# Patient Record
Sex: Female | Born: 1985 | Race: Black or African American | Hispanic: No | Marital: Single | Smoking: Current every day smoker
Health system: Southern US, Community
[De-identification: ages and names within clinical notes are randomized; demographics above are authoritative.]

## PROBLEM LIST (undated history)

## (undated) DIAGNOSIS — R87619 Unspecified abnormal cytological findings in specimens from cervix uteri: Secondary | ICD-10-CM

## (undated) DIAGNOSIS — IMO0002 Reserved for concepts with insufficient information to code with codable children: Secondary | ICD-10-CM

## (undated) DIAGNOSIS — O99320 Drug use complicating pregnancy, unspecified trimester: Principal | ICD-10-CM

## (undated) DIAGNOSIS — A609 Anogenital herpesviral infection, unspecified: Secondary | ICD-10-CM

## (undated) DIAGNOSIS — F1920 Other psychoactive substance dependence, uncomplicated: Principal | ICD-10-CM

## (undated) DIAGNOSIS — O99345 Other mental disorders complicating the puerperium: Secondary | ICD-10-CM

## (undated) DIAGNOSIS — F53 Postpartum depression: Secondary | ICD-10-CM

## (undated) DIAGNOSIS — O99330 Smoking (tobacco) complicating pregnancy, unspecified trimester: Secondary | ICD-10-CM

## (undated) DIAGNOSIS — IMO0001 Reserved for inherently not codable concepts without codable children: Secondary | ICD-10-CM

## (undated) DIAGNOSIS — O039 Complete or unspecified spontaneous abortion without complication: Secondary | ICD-10-CM

## (undated) DIAGNOSIS — O3430 Maternal care for cervical incompetence, unspecified trimester: Secondary | ICD-10-CM

## (undated) HISTORY — DX: Unspecified abnormal cytological findings in specimens from cervix uteri: R87.619

## (undated) HISTORY — DX: Postpartum depression: F53.0

## (undated) HISTORY — PX: CERVICAL CERCLAGE: SHX1329

## (undated) HISTORY — DX: Anogenital herpesviral infection, unspecified: A60.9

## (undated) HISTORY — DX: Other psychoactive substance dependence, uncomplicated: F19.20

## (undated) HISTORY — DX: Maternal care for cervical incompetence, unspecified trimester: O34.30

## (undated) HISTORY — DX: Reserved for concepts with insufficient information to code with codable children: IMO0002

## (undated) HISTORY — DX: Drug use complicating pregnancy, unspecified trimester: O99.320

## (undated) HISTORY — DX: Other mental disorders complicating the puerperium: O99.345

---
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
## 2006-01-15 ENCOUNTER — Emergency Department (HOSPITAL_COMMUNITY): Admission: EM | Admit: 2006-01-15 | Discharge: 2006-01-15 | Payer: Self-pay | Admitting: Family Medicine

## 2006-05-07 ENCOUNTER — Emergency Department (HOSPITAL_COMMUNITY): Admission: EM | Admit: 2006-05-07 | Discharge: 2006-05-07 | Payer: Self-pay | Admitting: Emergency Medicine

## 2008-12-28 ENCOUNTER — Emergency Department (HOSPITAL_COMMUNITY): Admission: EM | Admit: 2008-12-28 | Discharge: 2008-12-29 | Payer: Self-pay | Admitting: Emergency Medicine

## 2009-09-16 ENCOUNTER — Ambulatory Visit: Payer: Self-pay | Admitting: Obstetrics & Gynecology

## 2009-09-19 ENCOUNTER — Inpatient Hospital Stay (HOSPITAL_COMMUNITY): Admission: AD | Admit: 2009-09-19 | Discharge: 2009-09-20 | Payer: Self-pay | Admitting: Family Medicine

## 2009-09-19 ENCOUNTER — Ambulatory Visit: Payer: Self-pay | Admitting: Nurse Practitioner

## 2009-10-13 ENCOUNTER — Ambulatory Visit: Payer: Self-pay | Admitting: Obstetrics & Gynecology

## 2009-10-13 ENCOUNTER — Ambulatory Visit (HOSPITAL_COMMUNITY): Admission: RE | Admit: 2009-10-13 | Discharge: 2009-10-13 | Payer: Self-pay | Admitting: Obstetrics & Gynecology

## 2009-10-14 ENCOUNTER — Ambulatory Visit (HOSPITAL_COMMUNITY): Admission: RE | Admit: 2009-10-14 | Discharge: 2009-10-14 | Payer: Self-pay | Admitting: Obstetrics & Gynecology

## 2009-10-14 ENCOUNTER — Ambulatory Visit: Payer: Self-pay | Admitting: Obstetrics & Gynecology

## 2009-10-28 ENCOUNTER — Ambulatory Visit: Payer: Self-pay | Admitting: Obstetrics & Gynecology

## 2009-10-28 ENCOUNTER — Encounter: Payer: Self-pay | Admitting: Obstetrics & Gynecology

## 2009-10-29 ENCOUNTER — Encounter: Payer: Self-pay | Admitting: Obstetrics & Gynecology

## 2009-10-29 LAB — CONVERTED CEMR LAB: Trich, Wet Prep: NONE SEEN

## 2009-11-04 ENCOUNTER — Ambulatory Visit (HOSPITAL_COMMUNITY): Admission: RE | Admit: 2009-11-04 | Discharge: 2009-11-04 | Payer: Self-pay | Admitting: Obstetrics & Gynecology

## 2009-11-11 ENCOUNTER — Ambulatory Visit: Payer: Self-pay | Admitting: Obstetrics and Gynecology

## 2009-11-18 ENCOUNTER — Ambulatory Visit: Payer: Self-pay | Admitting: Obstetrics and Gynecology

## 2009-11-18 ENCOUNTER — Encounter (INDEPENDENT_AMBULATORY_CARE_PROVIDER_SITE_OTHER): Payer: Self-pay | Admitting: *Deleted

## 2009-11-25 ENCOUNTER — Ambulatory Visit: Payer: Self-pay | Admitting: Obstetrics & Gynecology

## 2009-11-25 ENCOUNTER — Ambulatory Visit (HOSPITAL_COMMUNITY): Admission: RE | Admit: 2009-11-25 | Discharge: 2009-11-25 | Payer: Self-pay | Admitting: Obstetrics & Gynecology

## 2009-12-02 ENCOUNTER — Ambulatory Visit: Payer: Self-pay | Admitting: Family Medicine

## 2009-12-03 ENCOUNTER — Ambulatory Visit: Payer: Self-pay | Admitting: Family

## 2009-12-03 ENCOUNTER — Ambulatory Visit (HOSPITAL_COMMUNITY): Admission: RE | Admit: 2009-12-03 | Discharge: 2009-12-03 | Payer: Self-pay | Admitting: Family Medicine

## 2009-12-08 ENCOUNTER — Ambulatory Visit: Payer: Self-pay | Admitting: Family Medicine

## 2009-12-08 ENCOUNTER — Encounter: Payer: Self-pay | Admitting: Obstetrics and Gynecology

## 2009-12-08 ENCOUNTER — Observation Stay (HOSPITAL_COMMUNITY): Admission: AD | Admit: 2009-12-08 | Discharge: 2009-12-08 | Payer: Self-pay | Admitting: Obstetrics and Gynecology

## 2009-12-18 DEATH — deceased

## 2010-01-19 ENCOUNTER — Encounter (INDEPENDENT_AMBULATORY_CARE_PROVIDER_SITE_OTHER): Payer: Self-pay | Admitting: *Deleted

## 2010-01-19 ENCOUNTER — Ambulatory Visit: Payer: Self-pay | Admitting: Obstetrics and Gynecology

## 2010-01-20 ENCOUNTER — Encounter (INDEPENDENT_AMBULATORY_CARE_PROVIDER_SITE_OTHER): Payer: Self-pay | Admitting: *Deleted

## 2010-01-20 LAB — CONVERTED CEMR LAB: Yeast Wet Prep HPF POC: NONE SEEN

## 2010-01-25 ENCOUNTER — Ambulatory Visit (HOSPITAL_COMMUNITY): Admission: RE | Admit: 2010-01-25 | Discharge: 2010-01-25 | Payer: Self-pay | Admitting: Family Medicine

## 2010-04-04 ENCOUNTER — Ambulatory Visit
Admission: RE | Admit: 2010-04-04 | Discharge: 2010-04-04 | Payer: Self-pay | Source: Home / Self Care | Attending: Obstetrics & Gynecology | Admitting: Obstetrics & Gynecology

## 2010-04-04 ENCOUNTER — Encounter: Payer: Self-pay | Admitting: Obstetrics & Gynecology

## 2010-04-04 LAB — CONVERTED CEMR LAB
HCV Ab: NEGATIVE
HIV: NONREACTIVE

## 2010-04-05 ENCOUNTER — Encounter: Payer: Self-pay | Admitting: Obstetrics & Gynecology

## 2010-04-05 LAB — CONVERTED CEMR LAB: Yeast Wet Prep HPF POC: NONE SEEN

## 2010-04-09 ENCOUNTER — Encounter: Payer: Self-pay | Admitting: Obstetrics & Gynecology

## 2010-04-10 ENCOUNTER — Encounter: Payer: Self-pay | Admitting: Obstetrics & Gynecology

## 2010-06-02 LAB — POCT URINALYSIS DIPSTICK
Bilirubin Urine: NEGATIVE
Bilirubin Urine: NEGATIVE
Glucose, UA: NEGATIVE mg/dL
Glucose, UA: NEGATIVE mg/dL
Nitrite: NEGATIVE
Protein, ur: NEGATIVE mg/dL
Protein, ur: NEGATIVE mg/dL
Urobilinogen, UA: 0.2 mg/dL (ref 0.0–1.0)
Urobilinogen, UA: 0.2 mg/dL (ref 0.0–1.0)
pH: 7 (ref 5.0–8.0)

## 2010-06-02 LAB — CBC
HCT: 36.7 % (ref 36.0–46.0)
Hemoglobin: 12.6 g/dL (ref 12.0–15.0)
MCHC: 34.2 g/dL (ref 30.0–36.0)
MCV: 93.6 fL (ref 78.0–100.0)
RDW: 13.8 % (ref 11.5–15.5)
WBC: 16.1 10*3/uL — ABNORMAL HIGH (ref 4.0–10.5)

## 2010-06-03 LAB — POCT URINALYSIS DIPSTICK
Bilirubin Urine: NEGATIVE
Bilirubin Urine: NEGATIVE
Glucose, UA: NEGATIVE mg/dL
Glucose, UA: NEGATIVE mg/dL
Ketones, ur: NEGATIVE mg/dL
Ketones, ur: NEGATIVE mg/dL
Protein, ur: NEGATIVE mg/dL
Specific Gravity, Urine: 1.02 (ref 1.005–1.030)

## 2010-06-04 LAB — POCT URINALYSIS DIP (DEVICE)
Glucose, UA: NEGATIVE mg/dL
Ketones, ur: NEGATIVE mg/dL
Nitrite: NEGATIVE
Specific Gravity, Urine: 1.015 (ref 1.005–1.030)
pH: 8.5 — ABNORMAL HIGH (ref 5.0–8.0)

## 2010-06-04 LAB — CBC
Hemoglobin: 12.3 g/dL (ref 12.0–15.0)
MCH: 32.3 pg (ref 26.0–34.0)
Platelets: 187 10*3/uL (ref 150–400)

## 2010-06-05 LAB — POCT URINALYSIS DIP (DEVICE)
Nitrite: NEGATIVE
Urobilinogen, UA: 0.2 mg/dL (ref 0.0–1.0)
pH: 8.5 — ABNORMAL HIGH (ref 5.0–8.0)

## 2010-06-05 LAB — URINE MICROSCOPIC-ADD ON: WBC, UA: NONE SEEN WBC/hpf (ref <3)

## 2010-06-05 LAB — WET PREP, GENITAL
Clue Cells Wet Prep HPF POC: NONE SEEN
Trich, Wet Prep: NONE SEEN

## 2010-06-05 LAB — GC/CHLAMYDIA PROBE AMP, GENITAL
Chlamydia, DNA Probe: NEGATIVE
GC Probe Amp, Genital: NEGATIVE

## 2010-06-05 LAB — URINALYSIS, ROUTINE W REFLEX MICROSCOPIC: pH: 5 (ref 5.0–8.0)

## 2010-06-05 LAB — CBC
MCV: 92.8 fL (ref 78.0–100.0)
Platelets: 163 10*3/uL (ref 150–400)
RDW: 13.1 % (ref 11.5–15.5)

## 2010-06-06 ENCOUNTER — Ambulatory Visit: Payer: 59

## 2010-06-06 DIAGNOSIS — Z23 Encounter for immunization: Secondary | ICD-10-CM

## 2010-06-23 LAB — URINALYSIS, ROUTINE W REFLEX MICROSCOPIC
Bilirubin Urine: NEGATIVE
Hgb urine dipstick: NEGATIVE
Ketones, ur: NEGATIVE mg/dL
Protein, ur: NEGATIVE mg/dL
Urobilinogen, UA: 1 mg/dL (ref 0.0–1.0)

## 2010-06-23 LAB — POCT PREGNANCY, URINE: Preg Test, Ur: NEGATIVE

## 2010-06-23 LAB — WET PREP, GENITAL: Trich, Wet Prep: NONE SEEN

## 2010-06-23 LAB — GC/CHLAMYDIA PROBE AMP, GENITAL
Chlamydia, DNA Probe: NEGATIVE
GC Probe Amp, Genital: NEGATIVE

## 2010-06-23 LAB — HERPES SIMPLEX VIRUS CULTURE

## 2010-06-23 LAB — RPR: RPR Ser Ql: NONREACTIVE

## 2010-10-03 ENCOUNTER — Ambulatory Visit: Payer: 59

## 2010-10-06 ENCOUNTER — Ambulatory Visit (INDEPENDENT_AMBULATORY_CARE_PROVIDER_SITE_OTHER): Payer: 59

## 2010-10-06 VITALS — BP 140/82 | HR 73

## 2010-10-06 DIAGNOSIS — Z23 Encounter for immunization: Secondary | ICD-10-CM

## 2010-10-06 MED ORDER — ACYCLOVIR 200 MG PO CAPS: 200.0000 mg | ORAL_CAPSULE | Freq: Two times a day (BID) | ORAL | Status: DC | PRN

## 2011-03-28 DIAGNOSIS — Z8751 Personal history of pre-term labor: Secondary | ICD-10-CM

## 2011-03-28 DIAGNOSIS — O09219 Supervision of pregnancy with history of pre-term labor, unspecified trimester: Secondary | ICD-10-CM | POA: Insufficient documentation

## 2011-03-28 DIAGNOSIS — N883 Incompetence of cervix uteri: Secondary | ICD-10-CM

## 2011-03-28 DIAGNOSIS — O3430 Maternal care for cervical incompetence, unspecified trimester: Secondary | ICD-10-CM | POA: Insufficient documentation

## 2011-03-28 DIAGNOSIS — F1920 Other psychoactive substance dependence, uncomplicated: Secondary | ICD-10-CM | POA: Insufficient documentation

## 2011-03-28 DIAGNOSIS — O99330 Smoking (tobacco) complicating pregnancy, unspecified trimester: Secondary | ICD-10-CM | POA: Insufficient documentation

## 2011-03-28 HISTORY — DX: Other psychoactive substance dependence, uncomplicated: F19.20

## 2011-03-28 HISTORY — DX: Maternal care for cervical incompetence, unspecified trimester: O34.30

## 2011-04-06 ENCOUNTER — Encounter: Payer: Self-pay | Admitting: Family Medicine

## 2011-04-06 ENCOUNTER — Ambulatory Visit (INDEPENDENT_AMBULATORY_CARE_PROVIDER_SITE_OTHER): Payer: Medicaid Other | Admitting: Family Medicine

## 2011-04-06 DIAGNOSIS — O3430 Maternal care for cervical incompetence, unspecified trimester: Secondary | ICD-10-CM

## 2011-04-06 DIAGNOSIS — A609 Anogenital herpesviral infection, unspecified: Secondary | ICD-10-CM | POA: Insufficient documentation

## 2011-04-06 DIAGNOSIS — Z8751 Personal history of pre-term labor: Secondary | ICD-10-CM

## 2011-04-06 DIAGNOSIS — F1920 Other psychoactive substance dependence, uncomplicated: Secondary | ICD-10-CM

## 2011-04-06 DIAGNOSIS — O99320 Drug use complicating pregnancy, unspecified trimester: Secondary | ICD-10-CM

## 2011-04-06 DIAGNOSIS — B009 Herpesviral infection, unspecified: Secondary | ICD-10-CM

## 2011-04-06 HISTORY — DX: Anogenital herpesviral infection, unspecified: A60.9

## 2011-04-06 LAB — POCT URINALYSIS DIP (DEVICE)
Glucose, UA: NEGATIVE mg/dL
Hgb urine dipstick: NEGATIVE
Specific Gravity, Urine: 1.02 (ref 1.005–1.030)
Urobilinogen, UA: 0.2 mg/dL (ref 0.0–1.0)
pH: 7 (ref 5.0–8.0)

## 2011-04-06 LAB — WET PREP, GENITAL: Trich, Wet Prep: NONE SEEN

## 2011-04-06 MED ORDER — BORIC ACID POWD: 1.0000 | Freq: Every evening | Status: DC | PRN

## 2011-04-06 MED ORDER — PRENATAL VITAMINS 0.8 MG PO TABS: 1.0000 | ORAL_TABLET | Freq: Every day | ORAL | Status: AC

## 2011-04-06 NOTE — Progress Notes (Signed)
Informal [place] scan for viability- cardiac activity observed, Dr. Shawnie Pons notified.

## 2011-04-06 NOTE — Patient Instructions (Signed)
Cervical Insufficiency Cervical insufficiency (CI) is when the cervix is not strong enough to keep a baby (fetus) inside the womb (uterus). It occurs in the 2nd and early 3rd trimesters of pregnancy. The cervix will enlarge (dilate) on its own without contractions. When this happens, the membranes around the fetus will often balloon down into the birth canal (vagina). The membranes may break, which could end the pregnancy (miscarriage). CAUSES  The cause of a cervical insufficiency is often not known. Possible causes include:  Injury to the cervix from a past pregnancy.   Injury to the cervix from past surgeries.   Being born with this defect of the cervix.   Cold cone, laser or LEEP (Loop electrocautery excision procedure) to the cervix.   Over dilating the cervix during an abortion.   Being exposed to DES (diethylstilbestrol) during pregnancy.   Lack of tissue (elastin and collagen) in the cervix that holds the baby in uterus.   Shorter cervix than normal.  SYMPTOMS   Spotting or bleeding from the vagina.   Feeling pressure in the vagina.   Unusual or abnormal vaginal discharge.  DIAGNOSIS   In many cases, the diagnosis is not made until after the pregnancy is lost.   Often this diagnosis will be made by exam.   Sometimes, an ultrasound of the cervix may be helpful. The ultrasound measures and follows the length of the cervix in women who are at risk of having CI.   Your caregiver may follow the dilatation of the cervix. Often, the diagnosis cannot be made until it happens. When this is the case, there is a much greater chance of early loss of the pregnancy. This means the baby is born too early to survive outside of the mother.  PREVENTION   A high risk patient needs to get frequent vaginal exams and serial ultrasounds.   Tie a suture, like a purse string, around the cervix (cerclage), before getting pregnant.  TREATMENT  When CI is diagnosed early, the treatment is a  cerclage. This gives the cervix added support. The cerclage helps carry the baby to term. This is usually done before the first trimester (12 to 14 weeks). Cerclage is usually not done after the second trimester (24 weeks) unless it is an emergency. Your caregiver can discuss the risks of this procedure. The cerclage suture may be removed when labor begins or at term before labor begins. The suture can also be left in place for future pregnancies. If left in place, the baby is delivered by Cesarean section. HOME CARE INSTRUCTIONS   Keep your follow-up prenatal appointments.   Take medication as directed by your caregiver.   Avoid physical activities, exercise and sexual intercourse until you have permission from your caregiver.   Do not douche or use tampons.   Resume your usual diet.  SEEK MEDICAL CARE IF:  You develop abnormal vaginal discharge. SEEK IMMEDIATE MEDICAL CARE IF:   You have a fever.   You develop uterine contractions.   You do not feel the baby moving or the baby is not moving as much as usual.   You pass out.   You have vaginal bleeding.   You are leaking fluid or have a gush of fluid from your vagina.   You have blood in your urine or pain when urinating.  Document Released: 03/06/2005 Document Revised: 11/16/2010 Document Reviewed: 06/24/2008 [employer] Patient Information 2012 [employer]. 

## 2011-04-06 NOTE — Progress Notes (Signed)
Addended by: Reva Bores on: 04/06/2011 11:51 AM   Modules accepted: Orders

## 2011-04-06 NOTE — Progress Notes (Signed)
Complains of d/c---will schedule with MFM for recommendations. Boric acid for recurrent BV Check wet prep Subjective:    Ellen Clements is a 26 y.o. [id number] [redacted]w[redacted]d being seen today for her obstetrical visit.  Patient reports no complaints. Fetal movement: normal.  Objective:    BP 123/84  Temp 97.6 F (36.4 C)  Wt 186 lb 12.8 oz (84.732 kg)  LMP 01/30/2011  Physical Exam  Exam  FHT:  pos on u/s  Uterine Size: size equals dates        Assessment:    Pregnancy:  [id number]    Plan:    Patient Active Problem List  Diagnoses  . History of pre-term labor  . Cervical incompetence, antepartum condition or complication  . Drug dependence, antepartum  . Tobacco use disorder complicating pregnancy, childbirth, or the puerperium, antepartum condition or complication  . HSV (herpes simplex virus) anogenital infection     Follow up in 2 Weeks.

## 2011-04-06 NOTE — Progress Notes (Signed)
[facility] consult 04/12/11 at 3pm.

## 2011-04-06 NOTE — Progress Notes (Signed)
Pain in lower abdomen. Pulse 78. Pt states had some  vaginal bleeding 2 weeks ago after intercourse. Pulse 78.

## 2011-04-07 ENCOUNTER — Other Ambulatory Visit: Payer: Self-pay | Admitting: Family Medicine

## 2011-04-07 ENCOUNTER — Telehealth: Payer: Self-pay | Admitting: *Deleted

## 2011-04-07 MED ORDER — METRONIDAZOLE 0.75 % VA GEL: 1.0000 | Freq: Two times a day (BID) | VAGINAL | Status: AC

## 2011-04-07 MED ORDER — TINIDAZOLE 500 MG PO TABS: 2.0000 g | ORAL_TABLET | Freq: Every day | ORAL | Status: DC

## 2011-04-07 NOTE — Telephone Encounter (Signed)
Message copied by Jill Side on Fri Apr 07, 2011  9:17 AM ------      Message from: Reva Bores      Created: Fri Apr 07, 2011  9:00 AM       Needs treatment for BV--Flagyl 500 mg po bid, advise pt. rx is at pharmacy

## 2011-04-07 NOTE — Telephone Encounter (Signed)
Called pt and informed her of test results requiring medication. We discussed the administration instructions and pt voiced understanding. I notified the pharmacy to cancel the previous Rx for Tindamax.

## 2011-04-12 ENCOUNTER — Ambulatory Visit (HOSPITAL_COMMUNITY)
Admission: RE | Admit: 2011-04-12 | Discharge: 2011-04-12 | Disposition: A | Discharge status: Home / Self Care | Payer: Medicaid Other | Source: Ambulatory Visit | Attending: Family Medicine | Admitting: Family Medicine

## 2011-04-12 DIAGNOSIS — O99330 Smoking (tobacco) complicating pregnancy, unspecified trimester: Secondary | ICD-10-CM | POA: Insufficient documentation

## 2011-04-12 DIAGNOSIS — A6000 Herpesviral infection of urogenital system, unspecified: Secondary | ICD-10-CM | POA: Insufficient documentation

## 2011-04-12 DIAGNOSIS — O98519 Other viral diseases complicating pregnancy, unspecified trimester: Secondary | ICD-10-CM | POA: Insufficient documentation

## 2011-04-12 DIAGNOSIS — O3430 Maternal care for cervical incompetence, unspecified trimester: Secondary | ICD-10-CM | POA: Insufficient documentation

## 2011-04-12 DIAGNOSIS — Z8751 Personal history of pre-term labor: Secondary | ICD-10-CM | POA: Insufficient documentation

## 2011-04-20 ENCOUNTER — Ambulatory Visit (INDEPENDENT_AMBULATORY_CARE_PROVIDER_SITE_OTHER): Payer: Medicaid Other | Admitting: Obstetrics & Gynecology

## 2011-04-20 VITALS — BP 121/79 | Temp 97.1°F | Ht 65.0 in | Wt 187.4 lb

## 2011-04-20 DIAGNOSIS — Z8751 Personal history of pre-term labor: Secondary | ICD-10-CM

## 2011-04-20 DIAGNOSIS — N898 Other specified noninflammatory disorders of vagina: Secondary | ICD-10-CM

## 2011-04-20 DIAGNOSIS — O3430 Maternal care for cervical incompetence, unspecified trimester: Secondary | ICD-10-CM

## 2011-04-20 DIAGNOSIS — O0990 Supervision of high risk pregnancy, unspecified, unspecified trimester: Secondary | ICD-10-CM | POA: Insufficient documentation

## 2011-04-20 DIAGNOSIS — O26899 Other specified pregnancy related conditions, unspecified trimester: Secondary | ICD-10-CM

## 2011-04-20 LAB — POCT URINALYSIS DIP (DEVICE)
Bilirubin Urine: NEGATIVE
Glucose, UA: NEGATIVE mg/dL
Hgb urine dipstick: NEGATIVE
Nitrite: NEGATIVE

## 2011-04-20 MED ORDER — FLUCONAZOLE 150 MG PO TABS: 150.0000 mg | ORAL_TABLET | Freq: Once | ORAL | Status: AC

## 2011-04-20 NOTE — Progress Notes (Signed)
MFM recommended transabdominal cerclage given prior losses with transvaginal cerclages in place, patient has preoperative appointment in [facility] on 04/27/11.  Also had NT scan appointment on 05/04/11. Recently treated for BV, but now has pink-tinged white clunky discharge.  On exam, vulvar irritation noted with pink discharge, no active bleeding, no cervical/uterine bleeding. Wet prep obtained; Diflucan presumptively e-prescribed.  Will follow up in 2 weeks.

## 2011-04-20 NOTE — Progress Notes (Signed)
Nutrition Note:  (Seen for 1st consult) Dx. Hx of ppd and drug dependence, overweight, smoker, cervical incompetency  Pt reports pregravid wt at 175-180#, current wt plots around 7#> expected at [redacted]w[redacted]d gestation. Pt reports good intake of 5-6 small meals/snacks, no nausea and vomiting and no food allergies.  Pt reports excessive soda and juice intake which may contribute to excessive wt gain.  Pt works 2 jobs but reports both require a lot of sitting and little activity. Pt do not want to increase activity because reports that it makes her bleed.  Pt does receive WIC services and plans to breastfeed.  Disc wt gain goals of 15-25# and need to limit sodas and juice and increase water.  Follow up in 4-6 weeks.  Cy Blamer, RD

## 2011-04-20 NOTE — Patient Instructions (Signed)
Return to clinic for any obstetric concerns or go to [facility] for evaluation  

## 2011-04-20 NOTE — Progress Notes (Signed)
Pain/pressure- lower abd.  Vaginal discharge-"pink-tinged w/ chunky white discharge, sometimes itchy, no odor"

## 2011-04-21 LAB — WET PREP, GENITAL
Clue Cells Wet Prep HPF POC: NONE SEEN
Trich, Wet Prep: NONE SEEN

## 2011-05-04 ENCOUNTER — Other Ambulatory Visit: Payer: Self-pay

## 2011-05-04 ENCOUNTER — Ambulatory Visit (INDEPENDENT_AMBULATORY_CARE_PROVIDER_SITE_OTHER): Payer: Medicaid Other | Admitting: Physician Assistant

## 2011-05-04 ENCOUNTER — Ambulatory Visit (HOSPITAL_COMMUNITY)
Admission: RE | Admit: 2011-05-04 | Discharge: 2011-05-04 | Disposition: A | Discharge status: Home / Self Care | Payer: Medicaid Other | Source: Ambulatory Visit | Attending: Obstetrics & Gynecology | Admitting: Obstetrics & Gynecology

## 2011-05-04 VITALS — BP 137/84 | Temp 97.6°F | Wt 189.7 lb

## 2011-05-04 DIAGNOSIS — Z8751 Personal history of pre-term labor: Secondary | ICD-10-CM | POA: Insufficient documentation

## 2011-05-04 DIAGNOSIS — O351XX0 Maternal care for (suspected) chromosomal abnormality in fetus, not applicable or unspecified: Secondary | ICD-10-CM | POA: Insufficient documentation

## 2011-05-04 DIAGNOSIS — F1920 Other psychoactive substance dependence, uncomplicated: Secondary | ICD-10-CM

## 2011-05-04 DIAGNOSIS — O99330 Smoking (tobacco) complicating pregnancy, unspecified trimester: Secondary | ICD-10-CM | POA: Insufficient documentation

## 2011-05-04 DIAGNOSIS — O3440 Maternal care for other abnormalities of cervix, unspecified trimester: Secondary | ICD-10-CM | POA: Insufficient documentation

## 2011-05-04 DIAGNOSIS — O09299 Supervision of pregnancy with other poor reproductive or obstetric history, unspecified trimester: Secondary | ICD-10-CM | POA: Insufficient documentation

## 2011-05-04 DIAGNOSIS — O99320 Drug use complicating pregnancy, unspecified trimester: Secondary | ICD-10-CM

## 2011-05-04 DIAGNOSIS — O0990 Supervision of high risk pregnancy, unspecified, unspecified trimester: Secondary | ICD-10-CM

## 2011-05-04 DIAGNOSIS — O3430 Maternal care for cervical incompetence, unspecified trimester: Secondary | ICD-10-CM

## 2011-05-04 DIAGNOSIS — O3510X0 Maternal care for (suspected) chromosomal abnormality in fetus, unspecified, not applicable or unspecified: Secondary | ICD-10-CM | POA: Insufficient documentation

## 2011-05-04 LAB — POCT URINALYSIS DIP (DEVICE)
Bilirubin Urine: NEGATIVE
Leukocytes, UA: NEGATIVE
Nitrite: NEGATIVE
Protein, ur: NEGATIVE mg/dL
pH: 8.5 — ABNORMAL HIGH (ref 5.0–8.0)

## 2011-05-04 NOTE — Patient Instructions (Signed)
Cerclage of the Cervix Cerclage of the cervix is a surgical procedure for an incompetent cervix. An incompetent cervix is a weak cervix that opens up before labor begins. Cerclage of the cervix sews the cervix closed during pregnancy.  LET YOUR CAREGIVER KNOW ABOUT:   Allergies to foods or medications.   All over-the-counter, prescription, herbal, eye drops and cream medications you are using.   Taking illegal drugs or drinking an excessive amount of alcohol.   Any recent colds or infections.   Past problems with anesthetics or novocaine.   Past surgery.   History of blood clots or abnormal bleeding problems.   Other medical or health problems.  RISKS AND COMPLICATIONS   Infection.   Bleeding.   Rupturing the amniotic sac (membranes).   Going into early labor and delivery.   Problems with the anesthesia.   Infection of the amniotic sac.  BEFORE THE PROCEDURE   Do not take aspirin.   Do not eat or drink anything 8 hours before the procedure.   Do not smoke.   If you are being admitted the same day as the procedure, arrive at the hospital at least 60 minutes before the surgery or as directed. During this time, you will sign the necessary forms and get prepared for the surgery.   A waiting area is available for family and friends.  PROCEDURE   You will be given an IV (intravenous) and medication to relax you.   You will be put to sleep with a general anesthetic.   A stitch will be placed in and around the cervix to tighten it and keep it closed.  AFTER THE PROCEDURE   You will go to a recovery room where you and the baby are monitored.   Once you are awake, stable, and taking fluids well, barring other problems, you will be allowed to return to your room.   You will usually stay in the hospital overnight.   You may get an injection of progesterone to prevent uterine contractions.   Have someone drive you home and stay with you for a day or two.   You may be  given medications to take when you go home.  HOME CARE INSTRUCTIONS   Only take over-the-counter or prescriptions medicines for pain, discomfort or fever as directed by your caregiver.   Avoid physical activities and exercise until your caregiver says it is okay.   Resume your usual diet.   Do not douche.   Do not have sexual intercourse until your caregiver tells you it is OK.   Keep your follow up surgical and prenatal appointments with your caregiver.  SEEK MEDICAL CARE IF:   You have abnormal vaginal discharge.   You develop a rash.   You are having problems with your medications.   You become lightheaded or feel faint.  SEEK IMMEDIATE MEDICAL CARE IF:   You develop vaginal bleeding.   You are leaking fluid or have a gush of fluid from the vagina.   You develop a temperature of 102 F (38.9 C) or higher.   You pass out.   You have uterine contractions.   You feel the baby is not moving as much as usual or cannot feel the baby move.  Document Released: 02/17/2008 Document Revised: 11/16/2010 Document Reviewed: 02/17/2008 [facility] Patient Information 2012 [place], [place].

## 2011-05-04 NOTE — Progress Notes (Signed)
Pt having pain at incision sites, abdomen, and back.  Pt states is having brown discharge with itching.

## 2011-05-04 NOTE — Progress Notes (Signed)
Transabd cerclage placed at [facility] last week. C/o of abd discomfort at incision sites. All appear to be healing well no s/s infection. Appt with MFM today at 10am and [facility] next week. +FHT today.

## 2011-05-09 ENCOUNTER — Encounter: Payer: Self-pay | Admitting: *Deleted

## 2011-05-18 ENCOUNTER — Encounter: Payer: Medicaid Other | Admitting: Physician Assistant

## 2011-05-18 ENCOUNTER — Encounter: Payer: Medicaid Other | Admitting: Family Medicine

## 2011-05-25 ENCOUNTER — Ambulatory Visit (INDEPENDENT_AMBULATORY_CARE_PROVIDER_SITE_OTHER): Payer: Medicaid Other | Admitting: Obstetrics & Gynecology

## 2011-05-25 ENCOUNTER — Telehealth: Payer: Self-pay | Admitting: *Deleted

## 2011-05-25 VITALS — BP 118/78 | Temp 97.0°F | Wt 196.1 lb

## 2011-05-25 DIAGNOSIS — F1920 Other psychoactive substance dependence, uncomplicated: Secondary | ICD-10-CM

## 2011-05-25 DIAGNOSIS — O0990 Supervision of high risk pregnancy, unspecified, unspecified trimester: Secondary | ICD-10-CM

## 2011-05-25 DIAGNOSIS — N898 Other specified noninflammatory disorders of vagina: Secondary | ICD-10-CM

## 2011-05-25 DIAGNOSIS — O09219 Supervision of pregnancy with history of pre-term labor, unspecified trimester: Secondary | ICD-10-CM

## 2011-05-25 DIAGNOSIS — O3430 Maternal care for cervical incompetence, unspecified trimester: Secondary | ICD-10-CM

## 2011-05-25 LAB — POCT URINALYSIS DIP (DEVICE)
Ketones, ur: NEGATIVE mg/dL
Protein, ur: NEGATIVE mg/dL
Specific Gravity, Urine: 1.015 (ref 1.005–1.030)
pH: 7.5 (ref 5.0–8.0)

## 2011-05-25 NOTE — Telephone Encounter (Signed)
Patient left before her MFM Ultrasound appt was scheduled. I scheduled the appointment for her to be on March 19 at 1030. I attempted to call patient to notify her of this appointment, no answer, so I left her a voice mail to call us back.

## 2011-05-25 NOTE — Progress Notes (Signed)
Pt concerned about weight gain, has gained around 20 lbs since she found out she was pregnant. Has pain when she urinates and has a bowel movement. Has itchiness and irritation in vaginal area.

## 2011-05-26 LAB — WET PREP, GENITAL
Clue Cells Wet Prep HPF POC: NONE SEEN
Trich, Wet Prep: NONE SEEN

## 2011-05-26 NOTE — Telephone Encounter (Signed)
I left message for pt to call back and leave message on nurse voice mail whether or not we can leave details of upcoming appt on her voice mail.

## 2011-05-29 NOTE — Telephone Encounter (Signed)
Pt left message on Friday after clinic had closed that we may leave a message on her voice mail regading her appt. She would also like to know teat results. I returned her call this morning and informed her of normal test results. I also told pt of her [place] appt @ MFM on 3/19 @ 2:15pm. Pt stated that she has changed it to 3/20 @ 4:00 pm. Pt also stated that she needs to change her 17P injection appt to 3/14 in the morning because of work conflict. She would like to come @ 10:00am. I told her that will be fine.

## 2011-06-01 ENCOUNTER — Ambulatory Visit: Payer: Medicaid Other

## 2011-06-06 ENCOUNTER — Other Ambulatory Visit (HOSPITAL_COMMUNITY): Payer: Medicaid Other

## 2011-06-07 ENCOUNTER — Ambulatory Visit (HOSPITAL_COMMUNITY)
Admission: RE | Admit: 2011-06-07 | Discharge: 2011-06-07 | Disposition: A | Discharge status: Home / Self Care | Payer: Medicaid Other | Source: Ambulatory Visit | Attending: Obstetrics & Gynecology | Admitting: Obstetrics & Gynecology

## 2011-06-07 DIAGNOSIS — O3510X0 Maternal care for (suspected) chromosomal abnormality in fetus, unspecified, not applicable or unspecified: Secondary | ICD-10-CM | POA: Insufficient documentation

## 2011-06-07 DIAGNOSIS — O3430 Maternal care for cervical incompetence, unspecified trimester: Secondary | ICD-10-CM | POA: Insufficient documentation

## 2011-06-07 DIAGNOSIS — O351XX0 Maternal care for (suspected) chromosomal abnormality in fetus, not applicable or unspecified: Secondary | ICD-10-CM | POA: Insufficient documentation

## 2011-06-07 DIAGNOSIS — Z8751 Personal history of pre-term labor: Secondary | ICD-10-CM | POA: Insufficient documentation

## 2011-06-07 DIAGNOSIS — O09299 Supervision of pregnancy with other poor reproductive or obstetric history, unspecified trimester: Secondary | ICD-10-CM | POA: Insufficient documentation

## 2011-06-07 DIAGNOSIS — O0990 Supervision of high risk pregnancy, unspecified, unspecified trimester: Secondary | ICD-10-CM

## 2011-06-08 ENCOUNTER — Encounter: Payer: Medicaid Other | Admitting: Obstetrics and Gynecology

## 2011-06-09 ENCOUNTER — Encounter: Payer: Self-pay | Admitting: *Deleted

## 2011-06-16 NOTE — Progress Notes (Signed)
Obstetric ultrasound performed today.  Patient with 2nd trimester PPROM.   Exam limited by oligohydramnios.  Patient aware of the poor prognosis for this gestation.  She plans transfer to [facility] and has an appointment this week.  Precautions given.  Please see full report in [facility].

## 2011-07-26 ENCOUNTER — Ambulatory Visit (HOSPITAL_COMMUNITY): Payer: Medicaid Other

## 2014-11-01 DIAGNOSIS — Z79899 Other long term (current) drug therapy: Secondary | ICD-10-CM | POA: Insufficient documentation

## 2014-11-01 DIAGNOSIS — R202 Paresthesia of skin: Secondary | ICD-10-CM | POA: Insufficient documentation

## 2014-11-01 DIAGNOSIS — Z792 Long term (current) use of antibiotics: Secondary | ICD-10-CM | POA: Insufficient documentation

## 2014-11-01 DIAGNOSIS — Z8659 Personal history of other mental and behavioral disorders: Secondary | ICD-10-CM | POA: Insufficient documentation

## 2014-11-01 DIAGNOSIS — R51 Headache: Secondary | ICD-10-CM | POA: Insufficient documentation

## 2014-11-01 DIAGNOSIS — Z720 Tobacco use: Secondary | ICD-10-CM | POA: Insufficient documentation

## 2014-11-02 ENCOUNTER — Emergency Department (HOSPITAL_BASED_OUTPATIENT_CLINIC_OR_DEPARTMENT_OTHER)
Admission: EM | Admit: 2014-11-02 | Discharge: 2014-11-02 | Disposition: A | Discharge status: Home / Self Care | Payer: Medicaid Other | Attending: Emergency Medicine | Admitting: Emergency Medicine

## 2014-11-02 ENCOUNTER — Encounter (HOSPITAL_BASED_OUTPATIENT_CLINIC_OR_DEPARTMENT_OTHER): Payer: Self-pay | Admitting: Emergency Medicine

## 2014-11-02 DIAGNOSIS — R51 Headache: Secondary | ICD-10-CM

## 2014-11-02 DIAGNOSIS — R519 Headache, unspecified: Secondary | ICD-10-CM

## 2014-11-02 DIAGNOSIS — R202 Paresthesia of skin: Secondary | ICD-10-CM

## 2014-11-02 MED ORDER — KETOROLAC TROMETHAMINE 15 MG/ML IJ SOLN
15.0000 mg | Freq: Once | INTRAMUSCULAR | Status: AC
  Administered 2014-11-02: 15 mg via INTRAVENOUS
  Filled 2014-11-02: qty 1

## 2014-11-02 MED ORDER — DIPHENHYDRAMINE HCL 50 MG/ML IJ SOLN
25.0000 mg | Freq: Once | INTRAMUSCULAR | Status: AC
  Administered 2014-11-02: 25 mg via INTRAVENOUS
  Filled 2014-11-02: qty 1

## 2014-11-02 MED ORDER — METOCLOPRAMIDE HCL 5 MG/ML IJ SOLN
10.0000 mg | Freq: Once | INTRAMUSCULAR | Status: AC
  Administered 2014-11-02: 10 mg via INTRAVENOUS
  Filled 2014-11-02: qty 2

## 2014-11-02 MED ORDER — SODIUM CHLORIDE 0.9 % IV BOLUS (SEPSIS)
1000.0000 mL | Freq: Once | INTRAVENOUS | Status: AC
  Administered 2014-11-02: 1000 mL via INTRAVENOUS

## 2014-11-02 NOTE — ED Provider Notes (Signed)
CSN: [id number]     Arrival date & time 11/01/14  2356 History  This chart was scribed for Ellen Libra, MD by Placido Sou, ED scribe. This patient was seen in room MH03/MH03 and the patient's care was started at 12:36 AM.   Chief Complaint  Patient presents with  . Headache   HPI  HPI Comments: Ellen Clements is a 29 y.o. female who presents to the Emergency Department complaining of a constant, gradual onset, anterior HA with onset 2 days ago. She rates her current pain as 8/10 and notes taking Goody Powder for pain management which provided no relief. She notes an associated tingling sensation to her bilateral hands and feet, photophobia, a diffuse cold sensation and dry mouth. Pt notes that she was just seen at [facility] and was not satisfied with her care because no one would tell her why she was having paresthesias. Pt denies receiving any medications during that visit. She notes a hx of migraines but denies ever being formally diagnosed. She notes recently being on Bactrim and Flagyl for the past 4 days as well as being given Diflucan. Pt denies any recent SOB, nausea, vomiting or hyperventilation.   Past Medical History  Diagnosis Date  . Abnormal Pap smear     2006- colposcopy  . Postpartum depression   . 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 Cervical incompetence, antepartum condition or complication 03/28/2011   Past Surgical History  Procedure Laterality Date  . Cervical cerclage      2008;2011   Family History  Problem Relation Age of Onset  . Hypertension Mother    Social History  Substance Use Topics  . Smoking status: Current Every Day Smoker -- 1.00 packs/day    Types: Cigarettes  . Smokeless tobacco: Never Used  . Alcohol Use: No   OB History    Gravida Para Term Preterm AB TAB SAB Ectopic Multiple Living   0     Review of Systems  All other systems reviewed and are negative.  Allergies  Review of patient's  allergies indicates no known allergies.  Home Medications   Prior to Admission medications   Medication Sig Start Date End Date Taking? Authorizing Provider  fluconazole (DIFLUCAN) 150 MG tablet Take 150 mg by mouth daily.   Yes Historical Provider, MD  metroNIDAZOLE (FLAGYL) 500 MG tablet Take 500 mg by mouth 3 (three) times daily.   Yes Historical Provider, MD  NIFEdipine (PROCARDIA-XL/ADALAT CC) 30 MG 24 hr tablet Take 30 mg by mouth daily.   Yes Historical Provider, MD  sulfamethoxazole-trimethoprim (BACTRIM,SEPTRA) 200-40 MG/5ML suspension Take by mouth 2 (two) times daily.   Yes Historical Provider, MD  acyclovir (ZOVIRAX) 200 MG capsule Take 1 capsule (200 mg total) by mouth 2 (two) times daily as needed. 10/06/10   Ellen Clements, CNM  Boric Acid POWD 1 capsule by Does not apply route at bedtime as needed (2-3 x weekly). 04/06/11   Reva Bores, MD  flintstones complete (FLINTSTONES) 60 MG chewable tablet Chew 2 tablets by mouth daily.    Historical Provider, MD  hydrocodone-acetaminophen (LORCET-HD) 5-500 MG per capsule Take 1 capsule by mouth every 6 (six) hours as needed.    Historical Provider, MD  hydroxyprogesterone caproate (DELALUTIN) 250 mg/mL OIL Inject 250 mg into the muscle once a week.    Historical Provider, MD   BP 123/74 mmHg  Pulse 90  Temp(Src) 98.6 F (37 C) (  Oral)  Resp 20  Ht 5\' 5"  (1.651 m)  Wt 184 lb (83.462 kg)  BMI 30.62 kg/m2  SpO2 100%  LMP 10/19/2014  Breastfeeding? Unknown   Physical Exam  General: Well-developed, well-nourished female in no acute distress; appearance consistent with age of record HENT: normocephalic; atraumatic Eyes: pupils equal, round and reactive to light; extraocular muscles intact Neck: supple Heart: regular rate and rhythm Lungs: clear to auscultation bilaterally Abdomen: soft; nondistended; nontender; no masses or hepatosplenomegaly; bowel sounds present Extremities: No deformity; full range of motion; pulses normal;  sensation intact in fingertips  Neurologic: Awake, alert and oriented; motor function intact in all extremities and symmetric; no facial droop Skin: Warm and dry Psychiatric: Normal mood and affect  ED Course  Procedures  DIAGNOSTIC STUDIES: Oxygen Saturation is 100% on RA, normal by my interpretation.    COORDINATION OF CARE: 12:44 AM Discussed treatment plan with pt at bedside and pt agreed to plan.  MDM  2:56 AM Headache and paresthesias resolved after IV medications and fluids. Patient was advised that the causes for paresthesias are multiple but in her case may be related to her medications or to her current headache.  Ellen Libra, MD 11/02/14 [phone]

## 2014-11-02 NOTE — ED Notes (Addendum)
Patient states that she has had a HA since yesterday and states that she is having numbness and tingling to her extremities - she is on medication for BV and UTI and Yeast infection

## 2014-11-02 NOTE — ED Notes (Signed)
Dr. Read Drivers in to speak with pt about dx and plan.

## 2014-11-02 NOTE — ED Notes (Signed)
Up to b/r, "feel better", steady gait, pain 5/10, (denies: nausea, dizziness or other sx).

## 2014-11-02 NOTE — ED Notes (Signed)
Patient states she just left [facility] with headache, but left there AMA because they would not tell her why fingers and feet were tingling.

## 2016-06-01 ENCOUNTER — Encounter (HOSPITAL_BASED_OUTPATIENT_CLINIC_OR_DEPARTMENT_OTHER): Payer: Self-pay | Admitting: Emergency Medicine

## 2016-06-01 ENCOUNTER — Emergency Department (HOSPITAL_BASED_OUTPATIENT_CLINIC_OR_DEPARTMENT_OTHER)
Admission: EM | Admit: 2016-06-01 | Discharge: 2016-06-01 | Disposition: A | Discharge status: Home / Self Care | Payer: Medicaid Other | Attending: Emergency Medicine | Admitting: Emergency Medicine

## 2016-06-01 DIAGNOSIS — L02412 Cutaneous abscess of left axilla: Secondary | ICD-10-CM | POA: Diagnosis present

## 2016-06-01 DIAGNOSIS — F17210 Nicotine dependence, cigarettes, uncomplicated: Secondary | ICD-10-CM | POA: Diagnosis not present

## 2016-06-01 HISTORY — DX: Smoking (tobacco) complicating pregnancy, unspecified trimester: O99.330

## 2016-06-01 LAB — PREGNANCY, URINE: Preg Test, Ur: NEGATIVE

## 2016-06-01 MED ORDER — DOXYCYCLINE HYCLATE 100 MG PO TABS
100.0000 mg | ORAL_TABLET | Freq: Once | ORAL | Status: AC
  Administered 2016-06-01: 100 mg via ORAL
  Filled 2016-06-01: qty 1

## 2016-06-01 MED ORDER — DOXYCYCLINE HYCLATE 100 MG PO CAPS: 100.0000 mg | ORAL_CAPSULE | Freq: Two times a day (BID) | ORAL | 0 refills | Status: DC

## 2016-06-01 MED ORDER — IBUPROFEN 800 MG PO TABS
800.0000 mg | ORAL_TABLET | Freq: Once | ORAL | Status: AC
  Administered 2016-06-01: 800 mg via ORAL

## 2016-06-01 MED ORDER — IBUPROFEN 800 MG PO TABS
ORAL_TABLET | ORAL | Status: AC
  Filled 2016-06-01: qty 1

## 2016-06-01 NOTE — ED Triage Notes (Signed)
Pt c/o 2 painful lumps under left arm with drainage.

## 2016-06-01 NOTE — ED Provider Notes (Signed)
[facility] Provider Note: Lowella DellJ. Lane Janelis Stelzer, MD, FACEP  CSN: [id number] MRN: [id number] ARRIVAL: 06/01/16 at 0145 ROOM: MH02/MH02   CHIEF COMPLAINT  Abscess   HISTORY OF PRESENT ILLNESS  Ellen Clements is a 31 y.o. female with a 2 to three-day history of 2 tender, swollen areas of the left axilla. There is been some slight drainage from the lower of the 2. There is moderate associated pain, worse with movement or palpation. She denies systemic symptoms such as fever or chills. She is adamant that she does not wish I&D and is requesting an antibiotic.   Past Medical History:  Diagnosis Date  . Abnormal Pap smear    2006- colposcopy  . Cervical incompetence, antepartum condition or complication 03/28/2011  . 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 Postpartum depression   . Tobacco use disorder complicating pregnancy, childbirth, or the puerperium, antepartum condition or complication     Past Surgical History:  Procedure Laterality Date  . CERVICAL CERCLAGE     2008;2011    Family History  Problem Relation Age of Onset  . Hypertension Mother     Social History  Substance Use Topics  . Smoking status: Current Every Day Smoker    Packs/day: 1.00    Types: Cigarettes  . Smokeless tobacco: Never Used  . Alcohol use No    Prior to Admission medications   Medication Sig Start Date End Date Taking? Authorizing Provider  acyclovir (ZOVIRAX) 200 MG capsule Take 1 capsule (200 mg total) by mouth 2 (two) times daily as needed. 10/06/10   August LuzSuzanne E Shores, CNM  Boric Acid POWD 1 capsule by Does not apply route at bedtime as needed (2-3 x weekly). 04/06/11   Reva Boresanya S Pratt, MD  flintstones complete (FLINTSTONES) 60 MG chewable tablet Chew 2 tablets by mouth daily.    Historical Provider, MD  fluconazole (DIFLUCAN) 150 MG tablet Take 150 mg by mouth daily.    Historical Provider, MD  hydrocodone-acetaminophen (LORCET-HD) 5-500 MG per capsule Take 1 capsule by mouth  every 6 (six) hours as needed.    Historical Provider, MD  hydroxyprogesterone caproate (DELALUTIN) 250 mg/mL OIL Inject 250 mg into the muscle once a week.    Historical Provider, MD  metroNIDAZOLE (FLAGYL) 500 MG tablet Take 500 mg by mouth 3 (three) times daily.    Historical Provider, MD  NIFEdipine (PROCARDIA-XL/ADALAT CC) 30 MG 24 hr tablet Take 30 mg by mouth daily.    Historical Provider, MD  sulfamethoxazole-trimethoprim (BACTRIM,SEPTRA) 200-40 MG/5ML suspension Take by mouth 2 (two) times daily.    Historical Provider, MD    Allergies Bactrim [sulfamethoxazole-trimethoprim]   REVIEW OF SYSTEMS  Negative except as noted here or in the History of Present Illness.   PHYSICAL EXAMINATION  Initial Vital Signs Blood pressure 162/90, pulse 71, temperature 98.2 F (36.8 C), temperature source Oral, resp. rate 18, height 5\' 5"  (1.651 m), weight 200 lb (90.7 kg), SpO2 100 %, unknown if currently breastfeeding.  Examination General: Well-developed, well-nourished female in no acute distress; appearance consistent with age of record HENT: normocephalic; atraumatic Eyes: pupils equal, round and reactive to light; extraocular muscles intact Neck: supple Heart: regular rate and rhythm Lungs: clear to auscultation bilaterally Abdomen: soft; nondistended; nontender; bowel sounds present Extremities: No deformity; full range of motion Neurologic: Awake, alert and oriented; motor function intact in all extremities and symmetric; no facial droop Skin: Warm and dry; two tender, indurated nodules of the left axilla consistent with early abscesses Psychiatric:  Normal mood and affect   RESULTS  Summary of this visit's results, reviewed by myself:   EKG Interpretation  Date/Time:    Ventricular Rate:    PR Interval:    QRS Duration:   QT Interval:    QTC Calculation:   R Axis:     Text Interpretation:        Laboratory Studies: Results for orders placed or performed during the  hospital encounter of 06/01/16 (from the past 24 hour(s))  Pregnancy, urine     Status: None   Collection Time: 06/01/16  3:05 AM  Result Value Ref Range   Preg Test, Ur NEGATIVE NEGATIVE   Imaging Studies: No results found.  ED COURSE  Nursing notes and initial vitals signs, including pulse oximetry, reviewed.  Vitals:   06/01/16 0213 06/01/16 0215  BP: 162/90   Pulse: 71   Resp: 18   Temp: 98.2 F (36.8 C)   TempSrc: Oral   SpO2: 100%   Weight:  200 lb (90.7 kg)  Height:  5\' 5"  (1.651 m)    PROCEDURES    ED DIAGNOSES     ICD-9-CM ICD-10-CM   1. Abscess of left axilla 682.3 L02.412        Paula Libra, MD 06/01/16 [phone]

## 2016-10-06 ENCOUNTER — Emergency Department (HOSPITAL_BASED_OUTPATIENT_CLINIC_OR_DEPARTMENT_OTHER): Payer: Medicaid Other

## 2016-10-06 ENCOUNTER — Emergency Department (HOSPITAL_BASED_OUTPATIENT_CLINIC_OR_DEPARTMENT_OTHER)
Admission: EM | Admit: 2016-10-06 | Discharge: 2016-10-06 | Disposition: A | Discharge status: Home / Self Care | Payer: Medicaid Other | Attending: Emergency Medicine | Admitting: Emergency Medicine

## 2016-10-06 ENCOUNTER — Encounter (HOSPITAL_BASED_OUTPATIENT_CLINIC_OR_DEPARTMENT_OTHER): Payer: Self-pay

## 2016-10-06 DIAGNOSIS — F17210 Nicotine dependence, cigarettes, uncomplicated: Secondary | ICD-10-CM | POA: Diagnosis not present

## 2016-10-06 DIAGNOSIS — N939 Abnormal uterine and vaginal bleeding, unspecified: Secondary | ICD-10-CM | POA: Insufficient documentation

## 2016-10-06 HISTORY — DX: Complete or unspecified spontaneous abortion without complication: O03.9

## 2016-10-06 HISTORY — DX: Reserved for inherently not codable concepts without codable children: IMO0001

## 2016-10-06 LAB — CBC WITH DIFFERENTIAL/PLATELET
BASOS ABS: 0 10*3/uL (ref 0.0–0.1)
BASOS PCT: 0 %
EOS PCT: 3 %
Eosinophils Absolute: 0.2 10*3/uL (ref 0.0–0.7)
HCT: 35.9 % — ABNORMAL LOW (ref 36.0–46.0)
Hemoglobin: 12.1 g/dL (ref 12.0–15.0)
LYMPHS PCT: 32 %
Lymphs Abs: 1.9 10*3/uL (ref 0.7–4.0)
MCH: 30.3 pg (ref 26.0–34.0)
MCHC: 33.7 g/dL (ref 30.0–36.0)
MCV: 90 fL (ref 78.0–100.0)
MONO ABS: 0.4 10*3/uL (ref 0.1–1.0)
Monocytes Relative: 7 %
Neutro Abs: 3.5 10*3/uL (ref 1.7–7.7)
Neutrophils Relative %: 58 %
PLATELETS: 239 10*3/uL (ref 150–400)
RBC: 3.99 MIL/uL (ref 3.87–5.11)
RDW: 13.8 % (ref 11.5–15.5)
WBC: 6 10*3/uL (ref 4.0–10.5)

## 2016-10-06 LAB — PREGNANCY, URINE: PREG TEST UR: NEGATIVE

## 2016-10-06 NOTE — ED Provider Notes (Signed)
4:05 PM  D/w OB/GYN- she has reviewed ultrasound report and images, does not advise surgical intervention- no D and C needed.  Pt [facility]  If needed- although she does not feel it is needed at this time- could use methergen 0.3mg  po TID x 3 days to help contract uterus- she has reviewed ultrasound and feels uterus is contracted sufficiently but if bleeding worsens this med could be used (off-label use that OB prescribes in these situations).     Jerelyn ScottLinker, Martha, MD 10/06/16 [phone]

## 2016-10-06 NOTE — ED Notes (Signed)
Child given apple juice, graham crackers and PB.

## 2016-10-06 NOTE — ED Notes (Signed)
ED Provider at bedside. 

## 2016-10-06 NOTE — Discharge Instructions (Addendum)
Call Dr. Macon LargeAnyanwu or [facility] for follow up instructions.  [facility] if [facility] have increased bleeding, fainting, fever/chills, abdominal pain, decreased level of alertness/lethargy, or any other alarming symptoms

## 2016-10-06 NOTE — ED Provider Notes (Signed)
[facility] Provider Note   CSN: [id number] Arrival date & time: 10/06/16  1310     History   Chief Complaint Chief Complaint  Patient presents with  . Vaginal Bleeding    HPI Ellen Clements is a 31 y.o. female.Chief complaint is vaginal bleeding  HPI this is a 31 year old female with history of laparoscopic cerclage for infertility in 2014. Delivered via C-section. Has not followed up with GYN since delivery of her child. Skin pregnant and had medical there are pubic AB performed with medicine over 4 days at a clinic in [place]. She had passage of "tissue" on 68. She's had intermittent bleeding since that time. Heavier bleeding this morning with clots and minimal cramping she presents here. She has not spoken with GYN.  Past Medical History:  Diagnosis Date  . Abnormal Pap smear    2006- colposcopy  . Abortion   . Cervical incompetence, antepartum condition or complication 03/28/2011  . 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 Postpartum depression   . Tobacco use disorder complicating pregnancy, childbirth, or the puerperium, antepartum condition or complication     Patient Active Problem List   Diagnosis Date Noted  . High-risk pregnancy supervision 04/20/2011  . HSV (herpes simplex virus) anogenital infection 04/06/2011  . Pregnancy with history of pre-term labor 03/28/2011  . Cervical incompetence, antepartum condition or complication 03/28/2011  . 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 Tobacco use disorder complicating pregnancy, childbirth, or the puerperium, antepartum condition or complication 03/28/2011    Past Surgical History:  Procedure Laterality Date  . CERVICAL CERCLAGE     2008;2011    OB History    Gravida Para Term Preterm AB Living   5 1   1 3  0   SAB TAB Ectopic Multiple Live Births   2 1             Home Medications    Prior to Admission medications   Not on File    Family History Family History    Problem Relation Age of Onset  . Hypertension Mother     Social History Social History  Substance Use Topics  . Smoking status: Current Every Day Smoker    Packs/day: 1.00    Types: Cigarettes  . Smokeless tobacco: Never Used  . Alcohol use Yes     Comment: occ     Allergies   Bactrim [sulfamethoxazole-trimethoprim]   Review of Systems Review of Systems  Constitutional: Negative for appetite change, chills, diaphoresis, fatigue and fever.  HENT: Negative for mouth sores, sore throat and trouble swallowing.   Eyes: Negative for visual disturbance.  Respiratory: Negative for cough, chest tightness, shortness of breath and wheezing.   Cardiovascular: Negative for chest pain.  Gastrointestinal: Negative for abdominal distention, abdominal pain, diarrhea, nausea and vomiting.  Endocrine: Negative for polydipsia, polyphagia and polyuria.  Genitourinary: Positive for vaginal bleeding. Negative for dysuria, frequency and hematuria.  Musculoskeletal: Negative for gait problem.  Skin: Negative for color change, pallor and rash.  Neurological: Negative for dizziness, syncope, light-headedness and headaches.  Hematological: Does not bruise/bleed easily.  Psychiatric/Behavioral: Negative for behavioral problems and confusion.     Physical Exam Updated Vital Signs BP (!) 143/93 (BP Location: Left Arm)   Pulse 85   Temp 98.4 F (36.9 C) (Oral)   Resp 16   Ht 5\' 5"  (1.651 m)   Wt 90.7 kg (200 lb)   SpO2 100%   BMI 33.28 kg/m   Physical Exam  Constitutional: She  is oriented to person, place, and time. She appears well-developed and well-nourished. No distress.  HENT:  Head: Normocephalic.  Eyes: Pupils are equal, round, and reactive to light. Conjunctivae are normal. No scleral icterus.  Neck: Normal range of motion. Neck supple. No thyromegaly present.  Cardiovascular: Normal rate and regular rhythm.  Exam reveals no gallop and no friction rub.   No murmur  heard. Pulmonary/Chest: Effort normal and breath sounds normal. No respiratory distress. She has no wheezes. She has no rales.  Abdominal: Soft. Bowel sounds are normal. She exhibits no distension. There is no tenderness. There is no rebound.  Genitourinary:  Genitourinary Comments: Declines pelvic exam. States :my daughter is here, you don't need to do that"  Musculoskeletal: Normal range of motion.  Neurological: She is alert and oriented to person, place, and time.  Skin: Skin is warm and dry. No rash noted.  Psychiatric: She has a normal mood and affect. Her behavior is normal.     ED Treatments / Results  Labs (all labs ordered are listed, but only abnormal results are displayed) Labs Reviewed  CBC WITH DIFFERENTIAL/PLATELET - Abnormal; Notable for the following:       Result Value   HCT 35.9 (*)    All other components within normal limits  PREGNANCY, URINE    EKG  EKG Interpretation None       Radiology US Transvaginal Non-ob  Result Date: 10/06/2016 CLINICAL DATA:  Initial evaluation for heavy vaginal bleeding for 1 day. History of recent medically induced abortion. EXAM: TRANSABDOMINAL AND TRANSVAGINAL ULTRASOUND OF PELVIS TECHNIQUE: Both transabdominal and transvaginal ultrasound examinations of the pelvis were performed. Transabdominal technique was performed for global imaging of the pelvis including uterus, ovaries, adnexal regions, and pelvic cul-de-sac. It was necessary to proceed with endovaginal exam following the transabdominal exam to visualize the uterus and ovaries. COMPARISON:  None available. FINDINGS: Uterus Measurements: 8.1 x 4.8 x 5.7 cm. Uterus is retroverted with heterogeneous echotexture. No fibroid or discrete mass. Endometrium Thickness: 13 mm. Endometrium is thickened and heterogeneous with scattered areas of internal color flow, concerning for possible retained products of conception given history. Right ovary Measurements: 4.8 x 4.2 x 3.9 cm.  Complex cyst with internal lace-like architecture measures 4.0 x 3.4 x 3.5 cm, most consistent with a hemorrhagic cyst. Left ovary Measurements: 3.0 x 2.2 x 2.1 cm. Normal appearance/no adnexal mass. Other findings Small volume free fluid within the pelvis. IMPRESSION: 1. Thickened heterogeneous endometrium with scattered areas of internal flow. Given provided history, findings are concerning for possible retained products of conception. 2. 4.0 x 3.4 x 3.5 cm complex right ovarian cyst, most consistent with a hemorrhagic cyst. A follow-up ultrasound in 6-12 weeks to ensure resolution is suggested. Electronically Signed   By: Rise Mu M.D.   On: 10/06/2016 14:58   US Pelvis Complete  Result Date: 10/06/2016 CLINICAL DATA:  Initial evaluation for heavy vaginal bleeding for 1 day. History of recent medically induced abortion. EXAM: TRANSABDOMINAL AND TRANSVAGINAL ULTRASOUND OF PELVIS TECHNIQUE: Both transabdominal and transvaginal ultrasound examinations of the pelvis were performed. Transabdominal technique was performed for global imaging of the pelvis including uterus, ovaries, adnexal regions, and pelvic cul-de-sac. It was necessary to proceed with endovaginal exam following the transabdominal exam to visualize the uterus and ovaries. COMPARISON:  None available. FINDINGS: Uterus Measurements: 8.1 x 4.8 x 5.7 cm. Uterus is retroverted with heterogeneous echotexture. No fibroid or discrete mass. Endometrium Thickness: 13 mm. Endometrium is thickened and heterogeneous with scattered  areas of internal color flow, concerning for possible retained products of conception given history. Right ovary Measurements: 4.8 x 4.2 x 3.9 cm. Complex cyst with internal lace-like architecture measures 4.0 x 3.4 x 3.5 cm, most consistent with a hemorrhagic cyst. Left ovary Measurements: 3.0 x 2.2 x 2.1 cm. Normal appearance/no adnexal mass. Other findings Small volume free fluid within the pelvis. IMPRESSION: 1.  Thickened heterogeneous endometrium with scattered areas of internal flow. Given provided history, findings are concerning for possible retained products of conception. 2. 4.0 x 3.4 x 3.5 cm complex right ovarian cyst, most consistent with a hemorrhagic cyst. A follow-up ultrasound in 6-12 weeks to ensure resolution is suggested. Electronically Signed   By: Rise Mu M.D.   On: 10/06/2016 14:58    Procedures Procedures (including critical care time)  Medications Ordered in ED Medications - No data to display   Initial Impression / Assessment and Plan / ED Course  I have reviewed the triage vital signs and the nursing notes.  Pertinent labs & imaging results that were available during my care of the patient were reviewed by me and considered in my medical decision making (see chart for details).    vital signs stableHemoglobin states she was told she could not undergo D&C because of her cerclage. I left a message with her GYN at her fertility clinic. Her physician is not present at [place]. Apparently is practicing in [place] now. I left a message with our GYN on-call. She is in surgery and will return call afterwards. Patient has become somewhat impatient and is considering leaving. If so, discussed with her she would have to contact her GYN to arrange follow-up appointment. Encouraged her to stay until we can speak with GYN for more specific instructions.12. Ultrasound shows thickened endometrium with internal flow consistent for possible retained products   Awaiting return call from patient she will and, or on-call GYN. I left messages with both. Patient is desiring to leave. Discussed with her she would have to make her own arrangements for follow-up if she chooses to leave before specific instructions for follow-up insructions  Final Clinical Impressions(s) / ED Diagnoses   Final diagnoses:  Vaginal bleeding    New Prescriptions New Prescriptions   No medications on file      Rolland Porter, MD 10/06/16 1534

## 2016-10-06 NOTE — ED Triage Notes (Signed)
Pt c/o heavy vaginal bleeding x today-"abortion with the pill June 13"-NAD-steady gait

## 2016-10-06 NOTE — ED Notes (Addendum)
31 year old child in room with pt, playing on staff computer. Clicking and pushing keys. This EMT asked child to not do that, that she may break it. Child says I don't care. Pt asks child to sit down, child is non-compliant. Computer screen views is upside down and flashing. This EMT raised PC work station to above child's height, child then proceeded to pull at cords and PC hard drive box below work station, as well as the [employer] printer. RN entered room afterwards and child says "The computer is broken and won't turn on." Charge RN made aware of issue.

## 2016-11-14 ENCOUNTER — Encounter: Payer: Self-pay | Admitting: Obstetrics and Gynecology

## 2016-11-14 ENCOUNTER — Ambulatory Visit (INDEPENDENT_AMBULATORY_CARE_PROVIDER_SITE_OTHER): Payer: Medicaid Other | Admitting: Obstetrics and Gynecology

## 2016-11-14 VITALS — BP 137/96 | HR 77 | Ht 65.0 in | Wt 194.1 lb

## 2016-11-14 DIAGNOSIS — R1032 Left lower quadrant pain: Secondary | ICD-10-CM

## 2016-11-14 NOTE — Progress Notes (Signed)
Obstetrics and Gynecology New Patient Evaluation  Appointment Date: 11/14/2016  OB[facility]N Clinic: [facility]  Primary Care Provider: Patient, No Pcp Per  Referring Provider: [facility]  Chief Complaint:  ER follow up  History of Present Illness: Patient went to ED on 7/20 for VB (no pain) and diagnosed with possible rPOCs on u/s and had incidental RO 3-4cm complex cyst c/w hemorrhagic cyst. GYN phone consulted and recommend clinic follow up. CBC and UPT then were negative.   Patient states she had normal LMP (6-7d) on 8/9 and she is here b/c she wants to talk about: -chronic LLQ pain she's had since she had a medical TAB in [place] in June 2018. She states it doesn't feel like period cramps and comes and goes. She states she has pain when she has a BM -what they saw on the u/s in the ED -she would like a Derm consult -she would like to talk about [facility] -she wants to know if she still has PID. She states she went to the [facility] two weeks ago and was tested and presumptively treated for PID; she states she never heard back from them re: any + test results -she states she has a odor and is curious about her "hormones"  Patient states she still has her cerclage in place.  Review of Systems: as noted in the History of Present Illness.  Past Medical History:  Past Medical History:  Diagnosis Date  . Abnormal Pap smear    2006- colposcopy  . Abortion   . Cervical incompetence, antepartum condition or complication 03/28/2011  . 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 Postpartum depression   . Tobacco use disorder complicating pregnancy, childbirth, or the puerperium, antepartum condition or complication     Past Surgical History:  Past Surgical History:  Procedure Laterality Date  . CERVICAL CERCLAGE     2008;2011  . CESAREAN SECTION  2014    Past Obstetrical History:  OB History  Gravida Para Term Preterm AB Living  6 2 1 1 4  0  SAB TAB Ectopic Multiple Live  Births  2 2     1     # Outcome Date GA Lbr Len/2nd Weight Sex Delivery Anes PTL Lv  6 Preterm 2011 [redacted]w[redacted]d            Birth Comments: TIUP, Cerclage  5 SAB 2009 [redacted]w[redacted]d            Birth Comments: cerclage  4 TAB 2008 [redacted]w[redacted]d         3 SAB 2006 [redacted]w[redacted]d         2 Term           1 TAB               Past Gynecological History: As per HPI.  Social History:  Social History   Social History  . Marital status: Single    Spouse name: N/A  . Number of children: N/A  . Years of education: N/A   Occupational History  . Not on file.   Social History Main Topics  . Smoking status: Current Every Day Smoker    Packs/day: 1.00    Types: Cigarettes  . Smokeless tobacco: Never Used  . Alcohol use Yes     Comment: occ  . Drug use: Yes    Types: Marijuana  . Sexual activity: Not on file   Other Topics Concern  . Not on file   Social History Narrative  . No narrative on  file    Family History:  Family History  Problem Relation Age of Onset  . Hypertension Mother     Medications Ms. Fleites had no medications administered during this visit. No current outpatient prescriptions on file.   No current facility-administered medications for this visit.     Allergies Bactrim [sulfamethoxazole-trimethoprim]   Physical Exam:  BP (!) 137/96   Pulse 77   Ht 5\' 5"  (1.651 m)   Wt 194 lb 1.6 oz (88 kg)   LMP 10/26/2016 (Exact Date)   BMI 32.30 kg/m  Body mass index is 32.3 kg/m. NAD, pt is very argumentative and upset  Laboratory: none  Radiology:   CLINICAL DATA:  Initial evaluation for heavy vaginal bleeding for 1 day. History of recent medically induced abortion.  EXAM: TRANSABDOMINAL AND TRANSVAGINAL ULTRASOUND OF PELVIS  TECHNIQUE: Both transabdominal and transvaginal ultrasound examinations of the pelvis were performed. Transabdominal technique was performed for global imaging of the pelvis including uterus, ovaries, adnexal regions, and pelvic cul-de-sac. It was  necessary to proceed with endovaginal exam following the transabdominal exam to visualize the uterus and ovaries.  COMPARISON:  None available.  FINDINGS: Uterus  Measurements: 8.1 x 4.8 x 5.7 cm. Uterus is retroverted with heterogeneous echotexture. No fibroid or discrete mass.  Endometrium  Thickness: 13 mm. Endometrium is thickened and heterogeneous with scattered areas of internal color flow, concerning for possible retained products of conception given history.  Right ovary  Measurements: 4.8 x 4.2 x 3.9 cm. Complex cyst with internal lace-like architecture measures 4.0 x 3.4 x 3.5 cm, most consistent with a hemorrhagic cyst.  Left ovary  Measurements: 3.0 x 2.2 x 2.1 cm. Normal appearance/no adnexal mass.  Other findings  Small volume free fluid within the pelvis.  IMPRESSION: 1. Thickened heterogeneous endometrium with scattered areas of internal flow. Given provided history, findings are concerning for possible retained products of conception. 2. 4.0 x 3.4 x 3.5 cm complex right ovarian cyst, most consistent with a hemorrhagic cyst. A follow-up ultrasound in 6-12 weeks to ensure resolution is suggested.   Electronically Signed   By: Rise Mu M.D.   On: 10/06/2016 14:58  Assessment: pt very upset  Plan:  In trying to take her HPI she became very argumentative and upset with me b/c she is more concerned about the pain, but I told her that the only records I have indicate she was referred for her AUB and u/s findings.  I told her that since the pain isn't unbearable and impacting her ADLs, that I recommend doing it right after her period to decrease the risks of finding incidental cysts. She became very upset with me because she wants to talk about the pain and not cysts and the other issues identified earlier in the note. I told her that the pain and cysts could be the same issue but she was still upset and became even more upset when I  told her that with the type of appointment she has (ED referral appointment) that I can only address one specific issue and that we can make her an annual exam visit to go over her other issues, but that we can definitely do STD testing today. She was still very upset, and I asked her what I can do for her today and we can do the u/s earlier than my recommendation since she said that "I don't care about any cysts!"  Given her behavior and she stated she wanted another doctor (none available in clinic) will  have her speak with our Consulting civil engineer.   RTC PRN  Cornelia Copa MD Attending Center for [facility] (Faculty Practice)   Addendum: RN Manager spoke with patient and will do no charge visit  Cornelia Copa MD Attending Center for [employer] (Faculty Practice) 11/14/2016 Time: [phone]

## 2016-11-15 ENCOUNTER — Encounter: Payer: Self-pay | Admitting: Obstetrics & Gynecology

## 2016-11-15 ENCOUNTER — Ambulatory Visit (INDEPENDENT_AMBULATORY_CARE_PROVIDER_SITE_OTHER): Payer: Medicaid Other | Admitting: Obstetrics & Gynecology

## 2016-11-15 ENCOUNTER — Other Ambulatory Visit (HOSPITAL_COMMUNITY)
Admission: RE | Admit: 2016-11-15 | Discharge: 2016-11-15 | Disposition: A | Discharge status: Home / Self Care | Payer: Medicaid Other | Source: Ambulatory Visit | Attending: Obstetrics & Gynecology | Admitting: Obstetrics & Gynecology

## 2016-11-15 VITALS — BP 133/83 | HR 70 | Ht 65.0 in | Wt 195.6 lb

## 2016-11-15 DIAGNOSIS — Z30011 Encounter for initial prescription of contraceptive pills: Secondary | ICD-10-CM | POA: Diagnosis not present

## 2016-11-15 DIAGNOSIS — Z113 Encounter for screening for infections with a predominantly sexual mode of transmission: Secondary | ICD-10-CM | POA: Diagnosis not present

## 2016-11-15 DIAGNOSIS — N760 Acute vaginitis: Secondary | ICD-10-CM | POA: Diagnosis not present

## 2016-11-15 DIAGNOSIS — N898 Other specified noninflammatory disorders of vagina: Secondary | ICD-10-CM | POA: Insufficient documentation

## 2016-11-15 DIAGNOSIS — L74510 Primary focal hyperhidrosis, axilla: Secondary | ICD-10-CM | POA: Diagnosis not present

## 2016-11-15 DIAGNOSIS — R102 Pelvic and perineal pain: Secondary | ICD-10-CM | POA: Diagnosis not present

## 2016-11-15 DIAGNOSIS — N83209 Unspecified ovarian cyst, unspecified side: Secondary | ICD-10-CM

## 2016-11-15 DIAGNOSIS — B9689 Other specified bacterial agents as the cause of diseases classified elsewhere: Secondary | ICD-10-CM | POA: Diagnosis not present

## 2016-11-15 MED ORDER — ALUMINUM CHLORIDE 12 % EX SOLN: CUTANEOUS | 3 refills | Status: AC

## 2016-11-15 MED ORDER — NORGESTIMATE-ETH ESTRADIOL 0.25-35 MG-MCG PO TABS: 1.0000 | ORAL_TABLET | Freq: Every day | ORAL | 11 refills | Status: AC

## 2016-11-15 MED ORDER — METRONIDAZOLE 500 MG PO TABS: 500.0000 mg | ORAL_TABLET | Freq: Two times a day (BID) | ORAL | 5 refills | Status: DC

## 2016-11-15 NOTE — Progress Notes (Signed)
GYNECOLOGY OFFICE VISIT NOTE  History:  31 y.o. [id number] here today for multiple concerns.  She was seen last month in the ED for increased bleeding and pain after TAB, diagnosed with 15 mm stripe concerning for POCs, and 4 cm hemorrhagic cyst.  She was supposed to follow up in [facility] but was unable to make appointment. She reports passing some tissue afterwards. Bleeding has since stopped but she contiued to have some pain and abnormal vaginal discharge. [facility] and reports being diagnosed with BV and PID.  She reports negative STI evaluation. She was treated with 2 week course of Doxycycline and one week course of Metronidazole.  Today, she still has mild pain and abnormal white, foul-smelling discharge and wants repeat testing. Also wants to be re-started on OCPs for contraception.   Patient also reports excessive sweating in her axilla region; gives off musty odor and interferes with her work and self-esteem.  Desires referral to Dermatology and any intervention that can be done.  She denies any abnormal vaginal bleeding, fevers, chills, dysuria, nausea, vomiting, other GI or GU symptoms or other general symptoms.  Past Medical History:  Diagnosis Date  . Abnormal Pap smear    2006- colposcopy  . Abortion   . Cervical incompetence, antepartum condition or complication 03/28/2011  . 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 HSV (herpes simplex virus) anogenital infection 04/06/2011   On suppression   . Postpartum depression   . Tobacco use disorder complicating pregnancy, childbirth, or the puerperium, antepartum condition or complication     Past Surgical History:  Procedure Laterality Date  . CERVICAL CERCLAGE     2008;2011  . CESAREAN SECTION  2014    The following portions of the patient's history were reviewed and updated as appropriate: allergies, current medications, past family history, past medical history, past social  history, past surgical history and problem list.   Health Maintenance:  Normal pap about 3 years ago as per patient.   Review of Systems:  Pertinent items noted in HPI and remainder of comprehensive ROS otherwise negative.   Objective:  Physical Exam BP 133/83   Pulse 70   Ht 5\' 5"  (1.651 m)   Wt 195 lb 9.6 oz (88.7 kg)   LMP 10/26/2016 (Exact Date)   Breastfeeding? No   BMI 32.55 kg/m  CONSTITUTIONAL: Well-developed, well-nourished female in no acute distress.  HENT:  Normocephalic, atraumatic. External right and left ear normal. Oropharynx is clear and moist EYES: Conjunctivae and EOM are normal. Pupils are equal, round, and reactive to light. No scleral icterus.  NECK: Normal range of motion, supple, no masses SKIN: Skin is warm and dry. No rash noted. Not diaphoretic. No erythema. No pallor. NEUROLOGIC: Alert and oriented to person, place, and time. Normal reflexes, muscle tone coordination. No cranial nerve deficit noted. PSYCHIATRIC: Normal mood and affect. Normal behavior. Normal judgment and thought content. CARDIOVASCULAR: Normal heart rate noted RESPIRATORY: Effort and breath sounds normal, no problems with respiration noted ABDOMEN: Soft, no distention noted. Mild lower abdominal tenderness to palpation.   PELVIC: Normal appearing external genitalia; normal appearing vaginal mucosa and cervix.  Copious white-yellow, malodorous discharge noted, testing sample obtained.  Normal uterine size, no other palpable masses, mild uterine tenderness. No adnexal tenderness. MUSCULOSKELETAL: Normal range of motion. No edema noted.  Labs and Imaging 10/06/2016  TRANSABDOMINAL AND TRANSVAGINAL ULTRASOUND OF PELVIS   CLINICAL DATA:  Initial evaluation for heavy vaginal bleeding for 1 day.  History of recent medically induced abortion. TECHNIQUE: Both transabdominal and transvaginal ultrasound examinations of the pelvis were performed. Transabdominal technique was performed for global imaging  of the pelvis including uterus, ovaries, adnexal regions, and pelvic cul-de-sac. It was necessary to proceed with endovaginal exam following the transabdominal exam to visualize the uterus and ovaries. COMPARISON:  None available. FINDINGS: Uterus Measurements: 8.1 x 4.8 x 5.7 cm. Uterus is retroverted with heterogeneous echotexture. No fibroid or discrete mass. Endometrium Thickness: 13 mm. Endometrium is thickened and heterogeneous with scattered areas of internal color flow, concerning for possible retained products of conception given history. Right ovary Measurements: 4.8 x 4.2 x 3.9 cm. Complex cyst with internal lace-like architecture measures 4.0 x 3.4 x 3.5 cm, most consistent with a hemorrhagic cyst. Left ovary Measurements: 3.0 x 2.2 x 2.1 cm. Normal appearance/no adnexal mass. Other findings Small volume free fluid within the pelvis. IMPRESSION: 1. Thickened heterogeneous endometrium with scattered areas of internal flow. Given provided history, findings are concerning for possible retained products of conception. 2. 4.0 x 3.4 x 3.5 cm complex right ovarian cyst, most consistent with a hemorrhagic cyst. A follow-up ultrasound in 6-12 weeks to ensure resolution is suggested. Electronically Signed   By: Rise Mu M.D.   On: 10/06/2016 14:58   Assessment & Plan:  1. Pelvic pain 2. Cyst of ovary, unspecified laterality Pelvic ultrasound ordered, will also follow up tests. - US Transvaginal Non-OB; Future - Cervicovaginal ancillary only  3. Oral contraception initiation Sprintec prescribed, condoms recommended for STI prevention. Will evaluate BP in one month. - norgestimate-ethinyl estradiol (ORTHO-CYCLEN,SPRINTEC,PREVIFEM) 0.25-35 MG-MCG tablet; Take 1 tablet by mouth daily.  Dispense: 1 Package; Refill: 11  4. Vaginal discharge 5. BV (bacterial vaginosis) At least BV suspected; will follow up test results. Flagyl prescribed. - metroNIDAZOLE (FLAGYL) 500 MG tablet; Take 1 tablet  (500 mg total) by mouth 2 (two) times daily.  Dispense: 14 tablet; Refill: 5  6. Hyperhidrosis of axilla Topical aluminum chloride prescribed after brief research of this topic; Dermatology referral ordered for further evaluation and management. - Ambulatory referral to Dermatology - Aluminum Chloride 12 % SOLN; Apply topically daily  Dispense: 1 Bottle; Refill: 3  Routine preventative health maintenance measures emphasized. Please refer to After Visit Summary for other counseling recommendations.   Return in about 4 weeks (around 12/13/2016) for Pap smear  and OCP check.   Total face-to-face time with patient: 25 minutes. Over 50% of encounter was spent on counseling and coordination of care.   Jaynie Collins, MD, FACOG Attending Obstetrician & Gynecologist, [facility] for [employer], [facility]

## 2016-11-15 NOTE — Patient Instructions (Signed)
Return to clinic for any scheduled appointments or for any gynecologic concerns as needed.   

## 2016-11-16 ENCOUNTER — Telehealth: Payer: Self-pay | Admitting: General Practice

## 2016-11-16 LAB — CERVICOVAGINAL ANCILLARY ONLY
BACTERIAL VAGINITIS: NEGATIVE
CANDIDA VAGINITIS: NEGATIVE
Chlamydia: NEGATIVE
NEISSERIA GONORRHEA: NEGATIVE
TRICH (WINDOWPATH): NEGATIVE

## 2016-11-16 NOTE — Telephone Encounter (Signed)
[facility] for Dermatology and they state referral has to come from PCP who is on patient's medicaid card. Called and discussed with patient. Patient verbalized understanding and will reach out to [facility]. Patient states her pharmacy told her the aluminum chloride that was prescribed only comes in 20% and they want to know if that is okay to change. Told patient I will contact her pharmacy and clarify with them. Patient verbalized understanding & had no questions

## 2016-11-22 ENCOUNTER — Ambulatory Visit (HOSPITAL_BASED_OUTPATIENT_CLINIC_OR_DEPARTMENT_OTHER): Payer: Medicaid Other

## 2016-11-24 ENCOUNTER — Telehealth: Payer: Self-pay | Admitting: *Deleted

## 2016-11-24 DIAGNOSIS — N898 Other specified noninflammatory disorders of vagina: Secondary | ICD-10-CM

## 2016-11-24 NOTE — Telephone Encounter (Signed)
Ellen Clements called this am and left a message she was seen around the 28th and given med for Bv and wants to know if she can get diflucan. States she thinks she has a yeast infection now. Also states was put on sprintec and thinks it is causing her blood pressure to increase and wants to know if she can get switched.

## 2016-11-27 MED ORDER — FLUCONAZOLE 150 MG PO TABS: 150.0000 mg | ORAL_TABLET | Freq: Once | ORAL | 0 refills | Status: AC

## 2016-11-27 NOTE — Telephone Encounter (Signed)
I called Ellen Clements and she reports she thinks she now has yeast because had antibiotic and for bv and now has vaginal itching. Informed her I can send in diflucan to her pharmacy. She also wants to switch birth control pills- states she does not want to come in for a bp check , but that she feels like she did before when her blood pressure was high - that her hands and feet  feel tingly and fall asleep . States she stopped the birth control pill Tuesday and all the symptoms went away.  She wants to try another pill. I informed her I would send a message to Dr. Lynetta MareAnyawu and we will get back to her with the doctor's reccomendation. She voices understanding.

## 2016-11-27 NOTE — Telephone Encounter (Signed)
She needs to come in for contraception counseling, will not prescribe another OCP at this time without face-to-face counseling with any provider.  Please call in Diflucan for her as requested. Thank you!  Jaynie CollinsUGONNA  Olimpia Tinch, MD, FACOG Attending Obstetrician & Gynecologist, [facility], [facility]

## 2016-11-28 ENCOUNTER — Telehealth: Payer: Self-pay | Admitting: *Deleted

## 2016-11-28 ENCOUNTER — Telehealth: Payer: Self-pay | Admitting: Family Medicine

## 2016-11-28 DIAGNOSIS — Z30011 Encounter for initial prescription of contraceptive pills: Secondary | ICD-10-CM

## 2016-11-28 MED ORDER — NORETHINDRONE 0.35 MG PO TABS: 1.0000 | ORAL_TABLET | Freq: Every day | ORAL | 11 refills | Status: DC

## 2016-11-28 NOTE — Telephone Encounter (Signed)
Patient called requesting [address] [facility] [address] [facility] [address]. Wanted to know who [facility] when she asked for [facility] to be [facility] [address].

## 2016-11-29 ENCOUNTER — Other Ambulatory Visit: Payer: Self-pay | Admitting: *Deleted

## 2016-11-29 DIAGNOSIS — Z30011 Encounter for initial prescription of contraceptive pills: Secondary | ICD-10-CM

## 2016-11-29 MED ORDER — NORETHINDRONE 0.35 MG PO TABS: 1.0000 | ORAL_TABLET | Freq: Every day | ORAL | 0 refills | Status: AC

## 2016-11-29 NOTE — Telephone Encounter (Signed)
Patient was transferred to me from front office.  She is requesting a different ocp because she feels like the sprintec she has been taking is raising her blood pressure and she has tingling in her hands and feet similar to when her blood pressure was high in the past. She has an appt with Dr Macon LargeAnyanwu on 9/26. Spoke with Dr Nira Retortegele who agreed to prescribe her progestin-only pills, one month supply to get her through till her appointment. Encouraged patient to take the pill at the exact same time daily in order to avoid pregnancy. Understanding voiced.

## 2016-12-13 ENCOUNTER — Ambulatory Visit: Payer: Medicaid Other | Admitting: Obstetrics & Gynecology

## 2016-12-13 ENCOUNTER — Encounter: Payer: Self-pay | Admitting: General Practice

## 2016-12-13 NOTE — Progress Notes (Unsigned)
Patient no showed for appt today. Per Dr Macon Large, patient can reschedule on her own accord

## 2016-12-27 ENCOUNTER — Telehealth: Payer: Self-pay | Admitting: General Practice

## 2016-12-27 NOTE — Telephone Encounter (Signed)
Patient called into office stating she is on her 3rd period in 5 weeks. Patient states she started sprintec after her appt in our office but stopped them after several days because she had numbness and tingling in her hands. Patient states she got a period on 9/6 that last for a week. Patient states she received a new OCP Rx and started them on 9/12. Patient states she took those for a week and started to spot so she stopped the pills. Patient reports another period 9/20 for a week. Patient states yesterday she started to spot again. Discussed with patient in depth that irregular bleeding is due to starting/stopping birth control which can be a normal side effect. Told patient by her changing medications and stopping them so often her body cannot establish a normal bleeding cycle. Told patient this will resolve on it's own if she doesn't start a new medication or birth control method. Patient verbalized understanding and states she just wants to get the depo shot. Told patient that is fine and asked when she last had unprotected intercourse. Patient states Sunday. Told patient she may come in 10/22 @ 1115 for depo but she cannot have sex or can use condoms between now and then. Patient verbalized understanding and was satisfied with plan of care. Patient states she missed an ultrasound appt and thinks she needs to have it done. Provided ultrasound's number for her to reschedule. Patient had no questions

## 2017-01-08 ENCOUNTER — Ambulatory Visit: Payer: Medicaid Other

## 2017-02-12 NOTE — Telephone Encounter (Signed)
Per chart review has since been started on Depo-provera

## 2017-05-10 ENCOUNTER — Other Ambulatory Visit: Payer: Self-pay

## 2017-05-10 ENCOUNTER — Encounter (HOSPITAL_BASED_OUTPATIENT_CLINIC_OR_DEPARTMENT_OTHER): Payer: Self-pay | Admitting: Emergency Medicine

## 2017-05-10 ENCOUNTER — Emergency Department (HOSPITAL_BASED_OUTPATIENT_CLINIC_OR_DEPARTMENT_OTHER)
Admission: EM | Admit: 2017-05-10 | Discharge: 2017-05-10 | Disposition: A | Discharge status: Home / Self Care | Payer: Medicaid Other | Attending: Emergency Medicine | Admitting: Emergency Medicine

## 2017-05-10 DIAGNOSIS — R2232 Localized swelling, mass and lump, left upper limb: Secondary | ICD-10-CM | POA: Insufficient documentation

## 2017-05-10 DIAGNOSIS — Z5321 Procedure and treatment not carried out due to patient leaving prior to being seen by health care provider: Secondary | ICD-10-CM | POA: Insufficient documentation

## 2017-05-10 NOTE — ED Notes (Signed)
Mom states she needs her daughter seen and diagnosed with bed bugs so she can demand her money back from the hotel she is staying in. Triage policy explained to pt and unknown wait time given to pt as critical patients continue to take rooms ahead of her.

## 2017-05-10 NOTE — ED Triage Notes (Signed)
Patient states that she and her daughter have some bug bites to her left wrist  - she is staying at a hotel

## 2017-05-10 NOTE — ED Notes (Signed)
Mother is upset that other people are being called back ahead of her. Triage process explained to pt. She remains mad and shouting in W/A that the process is not fair and she wants her 32 year old to go back next. Triage process again explained to pt. She sat down to wait for her room.

## 2017-12-26 ENCOUNTER — Emergency Department (HOSPITAL_BASED_OUTPATIENT_CLINIC_OR_DEPARTMENT_OTHER)
Admission: EM | Admit: 2017-12-26 | Discharge: 2017-12-26 | Disposition: A | Discharge status: Home / Self Care | Payer: Self-pay | Attending: Emergency Medicine | Admitting: Emergency Medicine

## 2017-12-26 ENCOUNTER — Encounter (HOSPITAL_BASED_OUTPATIENT_CLINIC_OR_DEPARTMENT_OTHER): Payer: Self-pay

## 2017-12-26 ENCOUNTER — Emergency Department (HOSPITAL_BASED_OUTPATIENT_CLINIC_OR_DEPARTMENT_OTHER): Payer: Self-pay

## 2017-12-26 ENCOUNTER — Other Ambulatory Visit: Payer: Self-pay

## 2017-12-26 DIAGNOSIS — O4690 Antepartum hemorrhage, unspecified, unspecified trimester: Secondary | ICD-10-CM

## 2017-12-26 DIAGNOSIS — O209 Hemorrhage in early pregnancy, unspecified: Secondary | ICD-10-CM | POA: Insufficient documentation

## 2017-12-26 DIAGNOSIS — Z3A00 Weeks of gestation of pregnancy not specified: Secondary | ICD-10-CM | POA: Insufficient documentation

## 2017-12-26 DIAGNOSIS — Z87891 Personal history of nicotine dependence: Secondary | ICD-10-CM | POA: Insufficient documentation

## 2017-12-26 DIAGNOSIS — Z79899 Other long term (current) drug therapy: Secondary | ICD-10-CM | POA: Insufficient documentation

## 2017-12-26 LAB — COMPREHENSIVE METABOLIC PANEL
ALT: 13 U/L (ref 0–44)
AST: 15 U/L (ref 15–41)
Albumin: 3.9 g/dL (ref 3.5–5.0)
Alkaline Phosphatase: 36 U/L — ABNORMAL LOW (ref 38–126)
Anion gap: 7 (ref 5–15)
BUN: 8 mg/dL (ref 6–20)
CO2: 23 mmol/L (ref 22–32)
Calcium: 8.7 mg/dL — ABNORMAL LOW (ref 8.9–10.3)
Chloride: 104 mmol/L (ref 98–111)
Creatinine, Ser: 0.69 mg/dL (ref 0.44–1.00)
GFR calc Af Amer: >60 mL/min (ref >60)
GFR calc non Af Amer: >60 mL/min (ref >60)
Glucose, Bld: 95 mg/dL (ref 70–99)
Potassium: 3.7 mmol/L (ref 3.5–5.1)
Sodium: 134 mmol/L — ABNORMAL LOW (ref 135–145)
Total Bilirubin: 0.6 mg/dL (ref 0.3–1.2)
Total Protein: 7.3 g/dL (ref 6.5–8.1)

## 2017-12-26 LAB — CBC WITH DIFFERENTIAL/PLATELET
Abs Immature Granulocytes: 0.01 10*3/uL (ref 0.00–0.07)
Basophils Absolute: 0 10*3/uL (ref 0.0–0.1)
Basophils Relative: 0 %
Eosinophils Absolute: 0.1 10*3/uL (ref 0.0–0.5)
Eosinophils Relative: 1 %
HCT: 38.7 % (ref 36.0–46.0)
Hemoglobin: 12.5 g/dL (ref 12.0–15.0)
Immature Granulocytes: 0 %
Lymphocytes Relative: 42 %
Lymphs Abs: 2.6 10*3/uL (ref 0.7–4.0)
MCH: 29.3 pg (ref 26.0–34.0)
MCHC: 32.3 g/dL (ref 30.0–36.0)
MCV: 90.6 fL (ref 80.0–100.0)
Monocytes Absolute: 0.4 10*3/uL (ref 0.1–1.0)
Monocytes Relative: 6 %
Neutro Abs: 3.1 10*3/uL (ref 1.7–7.7)
Neutrophils Relative %: 51 %
Platelets: 209 10*3/uL (ref 150–400)
RBC: 4.27 MIL/uL (ref 3.87–5.11)
RDW: 14.1 % (ref 11.5–15.5)
WBC: 6.2 10*3/uL (ref 4.0–10.5)
nRBC: 0 % (ref 0.0–0.2)

## 2017-12-26 LAB — PREGNANCY, URINE: Preg Test, Ur: POSITIVE — AB

## 2017-12-26 LAB — HCG, QUANTITATIVE, PREGNANCY: hCG, Beta Chain, Quant, S: 11112 m[IU]/mL — ABNORMAL HIGH (ref <5)

## 2017-12-26 MED ORDER — PRENATAL VITAMINS 0.8 MG PO TABS: 1.0000 | ORAL_TABLET | Freq: Every day | ORAL | 0 refills | Status: AC

## 2017-12-26 NOTE — ED Notes (Signed)
Patient transported to Ultrasound 

## 2017-12-26 NOTE — Discharge Instructions (Addendum)
Begin taking prenatal vitamins.  Please call your OB/GYN for further evaluation and repeat ultrasound in 2 weeks.  Please return the emergency department or go to the [facility] emergency department if you develop any new or worsening symptoms.

## 2017-12-26 NOTE — ED Triage Notes (Signed)
Pt c/o vaginal bleeding x 2 days-pos home preg test-NAD-steady gait

## 2017-12-26 NOTE — ED Provider Notes (Signed)
[facility] EMERGENCY DEPARTMENT Provider Note   CSN: [id number] Arrival date & time: 12/26/17  1732     History   Chief Complaint Chief Complaint  Patient presents with  . Vaginal Bleeding    HPI Ellen Clements is a 32 y.o. G69P1 female with history of several spontaneous abortion who presents with vaginal bleeding for the past 2 days.  She had a positive pregnancy test at home.  Patient has had mild abdominal cramping.  She denies nausea or vomiting.  She has history of incompetent cervix and has a cerclage.  Patient denies any other abdominal pain, chest pain, shortness of breath, fever.  Patient denies any abnormal vaginal discharge before this.  HPI  Past Medical History:  Diagnosis Date  . Abnormal Pap smear    2006- colposcopy  . Abortion   . Cervical incompetence, antepartum condition or complication 03/28/2011  . 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 HSV (herpes simplex virus) anogenital infection 04/06/2011   On suppression   . Postpartum depression   . Tobacco use disorder complicating pregnancy, childbirth, or the puerperium, antepartum condition or complication     There are no active problems to display for this patient.   Past Surgical History:  Procedure Laterality Date  . CERVICAL CERCLAGE     2008;2011  . CESAREAN SECTION  2014     OB History    Gravida  6   Para  2   Term  1   Preterm  1   AB  4   Living  0     SAB  2   TAB  2   Ectopic      Multiple      Live Births  1            Home Medications    Prior to Admission medications   Medication Sig Start Date End Date Taking? Authorizing Provider  Aluminum Chloride 12 % SOLN Apply topically daily 11/15/16   Anyanwu, Jethro Bastos, MD  metroNIDAZOLE (FLAGYL) 500 MG tablet Take 1 tablet (500 mg total) by mouth 2 (two) times daily. 11/15/16   Anyanwu, Jethro Bastos, MD  norethindrone (MICRONOR,CAMILA,ERRIN) 0.35 MG tablet Take 1 tablet (0.35 mg total) by mouth daily.  11/29/16   Degele, Kandra Nicolas, MD  norgestimate-ethinyl estradiol (ORTHO-CYCLEN,SPRINTEC,PREVIFEM) 0.25-35 MG-MCG tablet Take 1 tablet by mouth daily. 11/15/16   Anyanwu, Jethro Bastos, MD  Prenatal Multivit-Min-Fe-FA (PRENATAL VITAMINS) 0.8 MG tablet Take 1 tablet by mouth daily. 12/26/17   Emi Holes, PA-C    Family History Family History  Problem Relation Age of Onset  . Hypertension Mother     Social History Social History   Tobacco Use  . Smoking status: Former Smoker    Packs/day: 1.00    Types: Cigarettes  . Smokeless tobacco: Never Used  Substance Use Topics  . Alcohol use: Yes    Comment: occ  . Drug use: Yes    Types: Marijuana     Allergies   Bactrim [sulfamethoxazole-trimethoprim]   Review of Systems Review of Systems  Constitutional: Negative for chills and fever.  HENT: Negative for facial swelling and sore throat.   Respiratory: Negative for shortness of breath.   Cardiovascular: Negative for chest pain.  Gastrointestinal: Positive for abdominal pain (cramping). Negative for nausea and vomiting.  Genitourinary: Positive for vaginal bleeding. Negative for dysuria and vaginal discharge.  Musculoskeletal: Negative for back pain.  Skin: Negative for rash and wound.  Neurological: Negative for headaches.  Psychiatric/Behavioral: The patient is not nervous/anxious.      Physical Exam Updated Vital Signs BP 140/78 (BP Location: Right Arm)   Pulse 68   Temp 98.4 F (36.9 C) (Oral)   Resp 18   Ht 5\' 5"  (1.651 m)   Wt 90.7 kg   LMP 11/21/2017   SpO2 100%   BMI 33.28 kg/m   Physical Exam  Constitutional: She appears well-developed and well-nourished. No distress.  HENT:  Head: Normocephalic and atraumatic.  Mouth/Throat: Oropharynx is clear and moist. No oropharyngeal exudate.  Eyes: Pupils are equal, round, and reactive to light. Conjunctivae are normal. Right eye exhibits no discharge. Left eye exhibits no discharge. No scleral icterus.  Neck: Normal  range of motion. Neck supple. No thyromegaly present.  Cardiovascular: Normal rate, regular rhythm, normal heart sounds and intact distal pulses. Exam reveals no gallop and no friction rub.  No murmur heard. Pulmonary/Chest: Effort normal and breath sounds normal. No stridor. No respiratory distress. She has no wheezes. She has no rales.  Abdominal: Soft. Bowel sounds are normal. She exhibits no distension. There is no tenderness. There is no rebound and no guarding.  Musculoskeletal: She exhibits no edema.  Lymphadenopathy:    She has no cervical adenopathy.  Neurological: She is alert. Coordination normal.  Skin: Skin is warm and dry. No rash noted. She is not diaphoretic. No pallor.  Psychiatric: She has a normal mood and affect.  Nursing note and vitals reviewed.    ED Treatments / Results  Labs (all labs ordered are listed, but only abnormal results are displayed) Labs Reviewed  PREGNANCY, URINE - Abnormal; Notable for the following components:      Result Value   Preg Test, Ur POSITIVE (*)    All other components within normal limits  COMPREHENSIVE METABOLIC PANEL - Abnormal; Notable for the following components:   Sodium 134 (*)    Calcium 8.7 (*)    Alkaline Phosphatase 36 (*)    All other components within normal limits  HCG, QUANTITATIVE, PREGNANCY - Abnormal; Notable for the following components:   hCG, Beta Chain, Quant, S 11,112 (*)    All other components within normal limits  CBC WITH DIFFERENTIAL/PLATELET  GC/CHLAMYDIA PROBE AMP ([place]) NOT AT [facility]    EKG None  Radiology US Ob Comp < 14 Wks  Result Date: 12/26/2017 CLINICAL DATA:  Vaginal bleeding.  Positive pregnancy test. EXAM: OBSTETRIC <14 WK [place] AND TRANSVAGINAL OB US TECHNIQUE: Both transabdominal and transvaginal ultrasound examinations were performed for complete evaluation of the gestation as well as the maternal uterus, adnexal regions, and pelvic cul-de-sac. Transvaginal technique was performed  to assess early pregnancy. COMPARISON:  None. FINDINGS: Intrauterine gestational sac: Single Yolk sac:  Not Visualized. Embryo:  Not Visualized. Cardiac Activity: Not Visualized. Heart Rate: Not visualized bpm MSD: 9.2  mm   5 w   5 d CRL:    mm    w    d                  [place] EDC: Subchorionic hemorrhage:  None visualized. Maternal uterus/adnexae: Normal bilateral ovaries. There is a small amount of free fluid. IMPRESSION: Single intrauterine gestational sac is identified. No yolk sac or embryo is identified. This may be due to early pregnancy. Short-term follow-up ultrasound in 2 weeks is recommended. Electronically Signed   By: Sherian Rein M.D.   On: 12/26/2017 20:18   US Ob Transvaginal  Result Date: 12/26/2017 CLINICAL DATA:  Vaginal bleeding.  Positive pregnancy test. EXAM: OBSTETRIC <14 WK [place] AND TRANSVAGINAL OB US TECHNIQUE: Both transabdominal and transvaginal ultrasound examinations were performed for complete evaluation of the gestation as well as the maternal uterus, adnexal regions, and pelvic cul-de-sac. Transvaginal technique was performed to assess early pregnancy. COMPARISON:  None. FINDINGS: Intrauterine gestational sac: Single Yolk sac:  Not Visualized. Embryo:  Not Visualized. Cardiac Activity: Not Visualized. Heart Rate: Not visualized bpm MSD: 9.2  mm   5 w   5 d CRL:    mm    w    d                  [place] EDC: Subchorionic hemorrhage:  None visualized. Maternal uterus/adnexae: Normal bilateral ovaries. There is a small amount of free fluid. IMPRESSION: Single intrauterine gestational sac is identified. No yolk sac or embryo is identified. This may be due to early pregnancy. Short-term follow-up ultrasound in 2 weeks is recommended. Electronically Signed   By: Sherian Rein M.D.   On: 12/26/2017 20:18    Procedures Procedures (including critical care time)  Medications Ordered in ED Medications - No data to display   Initial Impression / Assessment and Plan / ED Course  I have  reviewed the triage vital signs and the nursing notes.  Pertinent labs & imaging results that were available during my care of the patient were reviewed by me and considered in my medical decision making (see chart for details).     Patient reports positive home pregnancy test with 2 days of spotting.  She has had mild intermittent cramping, no nausea or vomiting.  She denies any other vaginal discharge or concern for STD exposure.  CBC unremarkable.  BMP within normal limits.  hCG 11,112.  Pelvic ultrasound shows single intrauterine gestational sac, no yolk sac or embryo.  Repeat ultrasound in 2 weeks recommended.  Patient advised to follow-up with OB/GYN for this.  Initiate prenatal vitamins.  Return precautions discussed.  Patient understands and agrees with plan.  Patient vitals stable throughout ED course and discharged in satisfactory condition.  Final Clinical Impressions(s) / ED Diagnoses   Final diagnoses:  Vaginal bleeding in pregnancy    ED Discharge Orders         Ordered    Prenatal Multivit-Min-Fe-FA (PRENATAL VITAMINS) 0.8 MG tablet  Daily     12/26/17 2047           Emi Holes, PA-C 12/27/17 1315    Tilden Fossa, MD 12/28/17 1536

## 2018-06-04 ENCOUNTER — Encounter (HOSPITAL_BASED_OUTPATIENT_CLINIC_OR_DEPARTMENT_OTHER): Payer: Self-pay

## 2018-06-04 ENCOUNTER — Emergency Department (HOSPITAL_BASED_OUTPATIENT_CLINIC_OR_DEPARTMENT_OTHER)
Admission: EM | Admit: 2018-06-04 | Discharge: 2018-06-04 | Disposition: A | Discharge status: Home / Self Care | Payer: BLUE CROSS/BLUE SHIELD | Attending: Emergency Medicine | Admitting: Emergency Medicine

## 2018-06-04 ENCOUNTER — Emergency Department (HOSPITAL_BASED_OUTPATIENT_CLINIC_OR_DEPARTMENT_OTHER): Payer: BLUE CROSS/BLUE SHIELD

## 2018-06-04 DIAGNOSIS — F17210 Nicotine dependence, cigarettes, uncomplicated: Secondary | ICD-10-CM | POA: Insufficient documentation

## 2018-06-04 DIAGNOSIS — Z79899 Other long term (current) drug therapy: Secondary | ICD-10-CM | POA: Diagnosis not present

## 2018-06-04 DIAGNOSIS — F99 Mental disorder, not otherwise specified: Secondary | ICD-10-CM

## 2018-06-04 DIAGNOSIS — M25531 Pain in right wrist: Secondary | ICD-10-CM | POA: Diagnosis present

## 2018-06-04 DIAGNOSIS — Y939 Activity, unspecified: Secondary | ICD-10-CM | POA: Diagnosis not present

## 2018-06-04 DIAGNOSIS — M70841 Other soft tissue disorders related to use, overuse and pressure, right hand: Secondary | ICD-10-CM | POA: Diagnosis not present

## 2018-06-04 DIAGNOSIS — M778 Other enthesopathies, not elsewhere classified: Secondary | ICD-10-CM

## 2018-06-04 MED ORDER — NAPROXEN 375 MG PO TABS: ORAL_TABLET | ORAL | 0 refills | Status: AC

## 2018-06-04 MED ORDER — NAPROXEN 250 MG PO TABS
500.0000 mg | ORAL_TABLET | Freq: Once | ORAL | Status: AC
  Administered 2018-06-04: 500 mg via ORAL
  Filled 2018-06-04: qty 2

## 2018-06-04 MED ORDER — HYDROCODONE-ACETAMINOPHEN 5-325 MG PO TABS: 1.0000 | ORAL_TABLET | Freq: Four times a day (QID) | ORAL | 0 refills | Status: AC | PRN

## 2018-06-04 NOTE — ED Triage Notes (Signed)
Pt states the her right wrist has been hurting for 3 weeks now, has not injured it. Pt states the pain has gotten progressively worse. Pt states pain radiated from mid arm to thumb.

## 2018-06-04 NOTE — ED Notes (Addendum)
Pt was explained [facility], pt wanted to get them from the 24 hour pharmacy and was explained that they can not be switched once they go to a certain pharmacy due to opioid epidemic at this time. Pt stated she wanted a prescription to take to he pharmacy and was explained that by law MD cannot take scripts of pain meds to the pharmacy. Pt then states that patient was not asked about pharmacy and got very upset and states why didn't no body asked me which pharmacy I got to.

## 2018-06-04 NOTE — ED Provider Notes (Addendum)
[facility] Provider Note: Lowella Dell, MD, FACEP  CSN: [id number] MRN: [id number] ARRIVAL: 06/04/18 at 0528 ROOM: MH01/MH01   CHIEF COMPLAINT  Wrist Pain   HISTORY OF PRESENT ILLNESS  06/04/18 5:41 AM Ellen Clements is a 33 y.o. female with 3 weeks of pain in her right wrist.  The pain is located on the thenar side and is focused at the wrist flexure.  She has a "knot" just proximal to the wrist flexure.  She rates her pain as a 9 out of 10, worse with movement, particularly of the wrist or thumb, or palpation.  She has not taken anything for her symptoms.    Past Medical History:  Diagnosis Date  . Abnormal Pap smear    2006- colposcopy  . Abortion   . Cervical incompetence, antepartum condition or complication 03/28/2011  . 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 HSV (herpes simplex virus) anogenital infection 04/06/2011   On suppression   . Postpartum depression   . Tobacco use disorder complicating pregnancy, childbirth, or the puerperium, antepartum condition or complication     Past Surgical History:  Procedure Laterality Date  . CERVICAL CERCLAGE     2008;2011  . CESAREAN SECTION  2014    Family History  Problem Relation Age of Onset  . Hypertension Mother     Social History   Tobacco Use  . Smoking status: Current Every Day Smoker    Packs/day: 0.25    Types: Cigarettes  . Smokeless tobacco: Never Used  Substance Use Topics  . Alcohol use: Yes    Comment: occ  . Drug use: Yes    Types: Marijuana    Prior to Admission medications   Medication Sig Start Date End Date Taking? Authorizing Provider  Aluminum Chloride 12 % SOLN Apply topically daily 11/15/16   Anyanwu, Jethro Bastos, MD  HYDROcodone-acetaminophen (NORCO) 5-325 MG tablet Take 1 tablet by mouth every 6 (six) hours as needed for severe pain. 06/04/18   Doreather Hoxworth, MD  naproxen (NAPROSYN) 375 MG tablet Take 1 tablet twice daily for wrist pain. 06/04/18   Bilbo Carcamo, MD   norethindrone (MICRONOR,CAMILA,ERRIN) 0.35 MG tablet Take 1 tablet (0.35 mg total) by mouth daily. 11/29/16   Degele, Kandra Nicolas, MD  norgestimate-ethinyl estradiol (ORTHO-CYCLEN,SPRINTEC,PREVIFEM) 0.25-35 MG-MCG tablet Take 1 tablet by mouth daily. 11/15/16   Anyanwu, Jethro Bastos, MD  Prenatal Multivit-Min-Fe-FA (PRENATAL VITAMINS) 0.8 MG tablet Take 1 tablet by mouth daily. 12/26/17   Emi Holes, PA-C    Allergies Bactrim [sulfamethoxazole-trimethoprim]   REVIEW OF SYSTEMS  Negative except as noted here or in the History of Present Illness.   PHYSICAL EXAMINATION  Initial Vital Signs Blood pressure (!) 171/107, pulse 66, temperature 98.2 F (36.8 C), temperature source Oral, resp. rate 20, height 5\' 5"  (1.651 m), weight 90.7 kg, last menstrual period 05/20/2018, SpO2 100 %.  Examination General: Well-developed, well-nourished female in no acute distress; appearance consistent with age of record HENT: normocephalic; atraumatic Eyes: Normal appearance Neck: supple Heart: regular rate and rhythm Lungs: clear to auscultation bilaterally Abdomen: soft; nondistended; nontender; bowel sounds present Extremities: No deformity; tenderness of thenar side of right wrist with thickening of extensor pollicis longus tendon; right hand distally neurovascularly intact with intact tendon function Neurologic: Awake, alert and oriented; motor function intact in all extremities and symmetric; no facial droop Skin: Warm and dry Psychiatric: Angry mood with congruent affect   RESULTS  Summary of this visit's results, reviewed by myself:  EKG Interpretation  Date/Time:    Ventricular Rate:    PR Interval:    QRS Duration:   QT Interval:    QTC Calculation:   R Axis:     Text Interpretation:        Laboratory Studies: No results found for this or any previous visit (from the past 24 hour(s)). Imaging Studies: Dg Wrist Complete Right  Result Date: 06/04/2018 CLINICAL DATA:  Posttraumatic  right wrist pain for 2-3 weeks EXAM: RIGHT WRIST - COMPLETE 3+ VIEW COMPARISON:  None. FINDINGS: There is no evidence of fracture or dislocation. There is no evidence of arthropathy or other focal bone abnormality. Soft tissues are unremarkable. IMPRESSION: Negative. Electronically Signed   By: Marnee Spring M.D.   On: 06/04/2018 05:59    ED COURSE and MDM  Nursing notes and initial vitals signs, including pulse oximetry, reviewed.  Vitals:   06/04/18 0534 06/04/18 0535  BP: (!) 171/107   Pulse: 66   Resp: 20   Temp: 98.2 F (36.8 C)   TempSrc: Oral   SpO2: 100%   Weight:  90.7 kg  Height:  5\' 5"  (1.651 m)   Symptoms consistent with wrist tendinitis.  Will start on an NSAID and place in a Velcro splint will refer to hand surgery if symptoms are not improving.  6:29 AM Patient became very argumentative and demanding at discharge.  She insisted on something stronger than naproxen.  A brief course of hydrocodone was called into her preferred pharmacy.  She then expressed displeasure that this was not a 24-hour pharmacy even though it will be opening in 90 minutes.  She demanded to see her x-rays because she did not believe she had tendinitis and she did not believe my diagnosis.  She stated "I know this is not tendinitis.  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 Right wrist tendonitis M77.8   2. Inappropriate behavior F99        Caroll Weinheimer, Jonny Ruiz, MD 06/04/18 0606    Paula Libra, MD 06/04/18 2458    Paula Libra, MD 06/04/18 [phone]

## 2018-06-04 NOTE — ED Notes (Addendum)
While giving discharge paperwork pt was told that [facility] pt was asked at this time does your medication go to this pharmacy and pt stated yes. PT was upset that the medication was not working and the splint was not helping, pt was then explained that the RN will ask provider about some more pain meds to see anything will help at this time.

## 2018-06-04 NOTE — ED Notes (Signed)
Pt became verbally aggressive with staff. Pt cursing at staff. Pt asked to leave and continued arguing with staff. Security escorted pt out of department.

## 2018-06-04 NOTE — ED Notes (Addendum)
Pt complaining of medication given to patient and what pt was diagnosis with at this time she is verbally aggressive pt states that her pharmacy will not open in time and that she wants her pain med's to be sent to a 24 hour pharmacy pt explained medication to sent to pharmacy in chart and asked is this the pharmacy that you use to get meds and pt states yes pt then explained that once narcotics are sent e-scriped then they cannot be changed. Also stating that the splint was not working at this time. Pt was explained that this takes time and pt decided to.  Per MD we gave medication and followed protocol as order.

## 2018-06-04 NOTE — ED Notes (Signed)
RN asked MD for more pain meds at this time due to patient still in pain at this time.

## 2018-06-06 ENCOUNTER — Ambulatory Visit: Payer: Medicaid Other | Admitting: Family Medicine

## 2019-06-05 IMAGING — US US OB TRANSVAGINAL
1 series · 14 of 28 positions shown · non-contrast
Comparison: None.

CLINICAL DATA: Vaginal bleeding.  Positive pregnancy test.

EXAM:
OBSTETRIC <14 WK US AND TRANSVAGINAL OB US
TECHNIQUE: Both transabdominal and transvaginal ultrasound examinations were
performed for complete evaluation of the gestation as well as the
maternal uterus, adnexal regions, and pelvic cul-de-sac.
Transvaginal technique was performed to assess early pregnancy.

[Series 1: us ob transvaginal · 0.22mm/px · 14 of 28 slices shown]
[im 2/28]
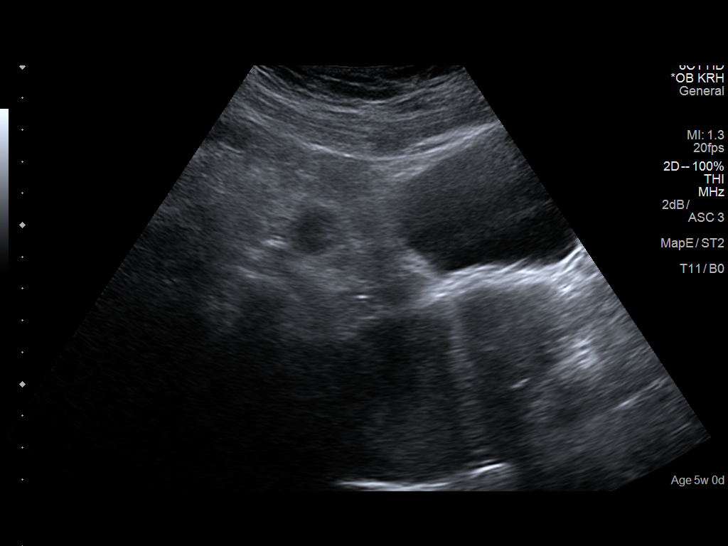
[im 4/28]
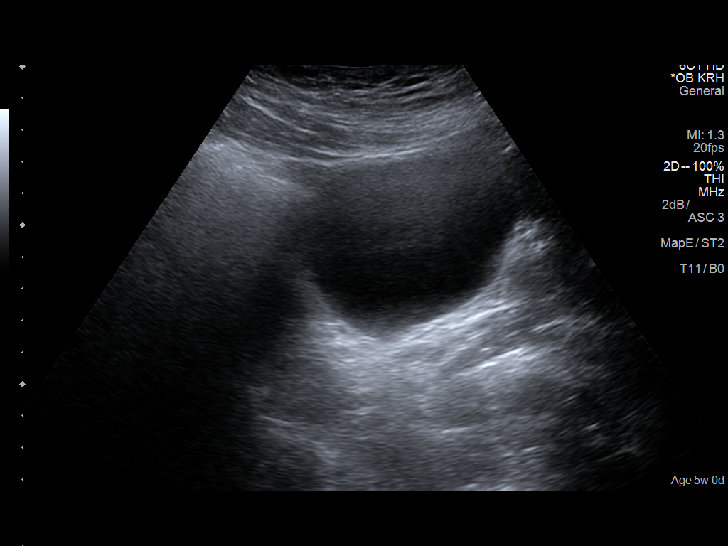
[im 6/28]
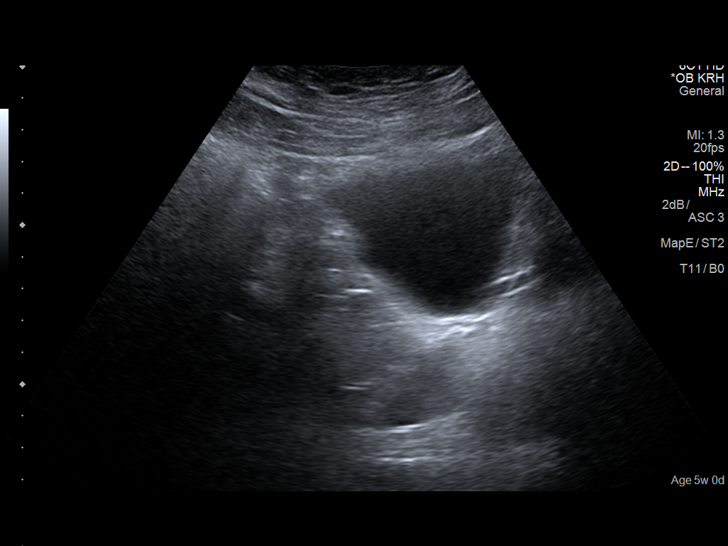
[im 8/28]
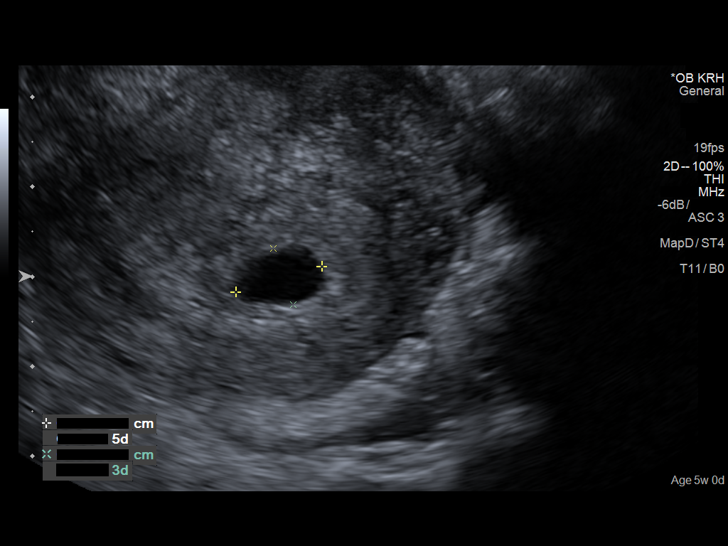
[im 10/28]
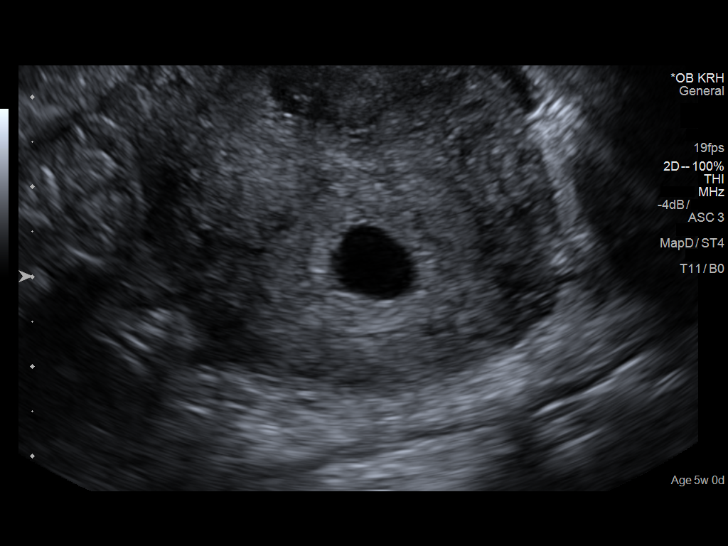
[im 12/28]
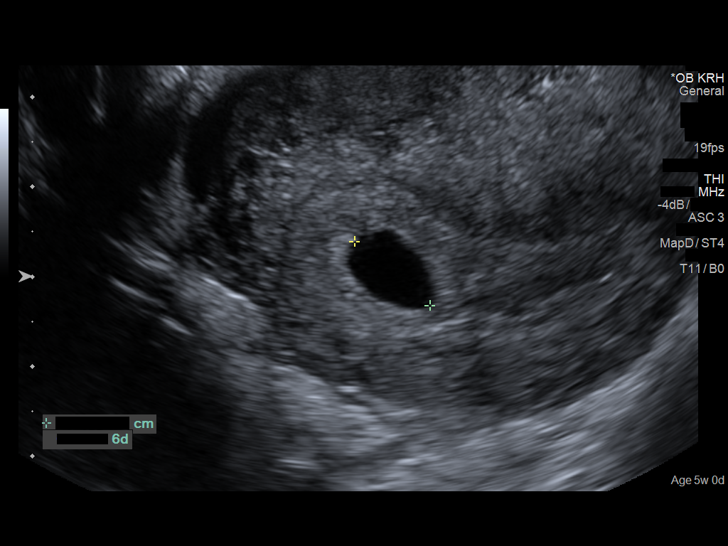
[im 14/28]
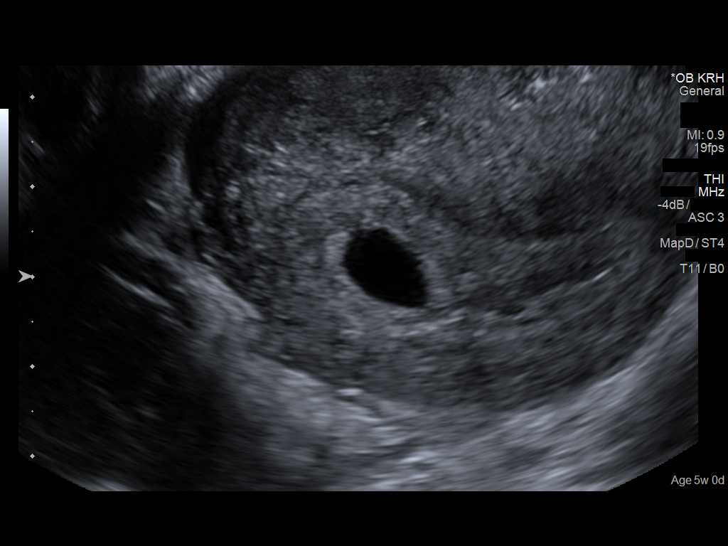
[im 16/28]
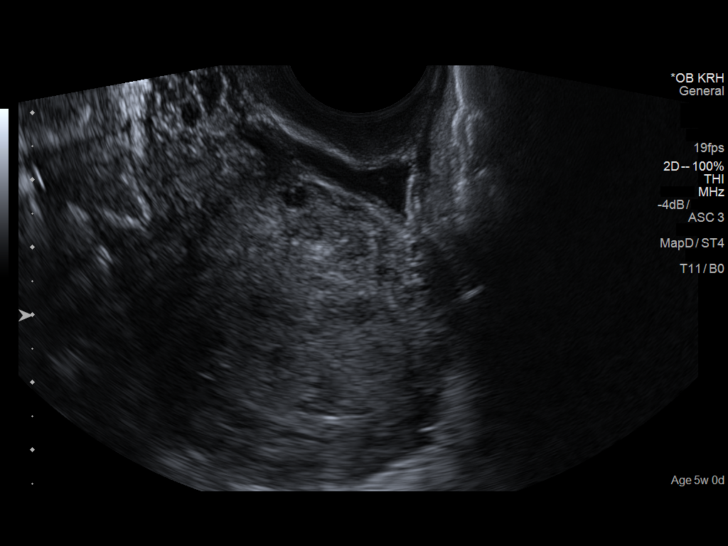
[im 18/28]
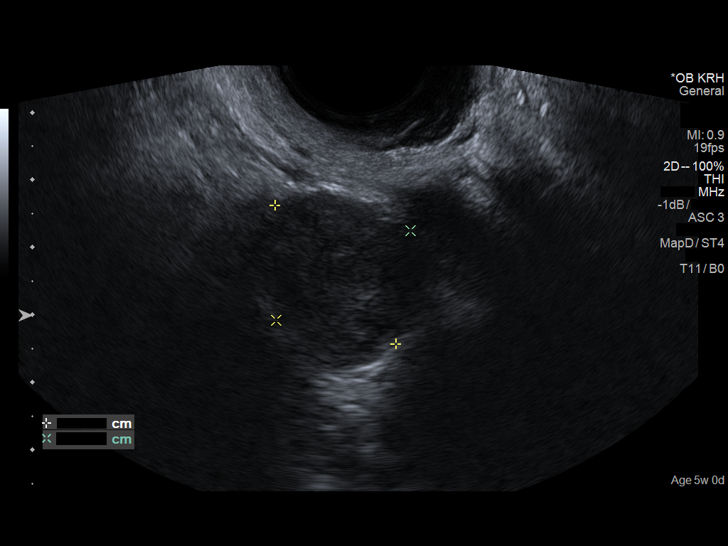
[im 20/28]
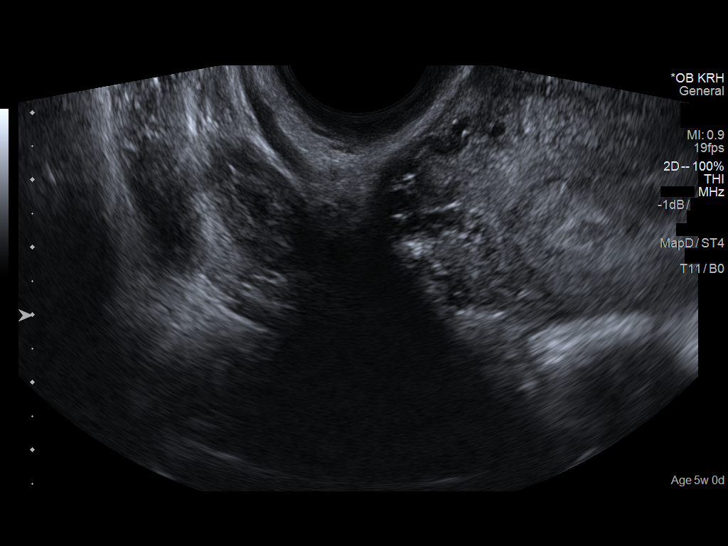
[im 22/28]
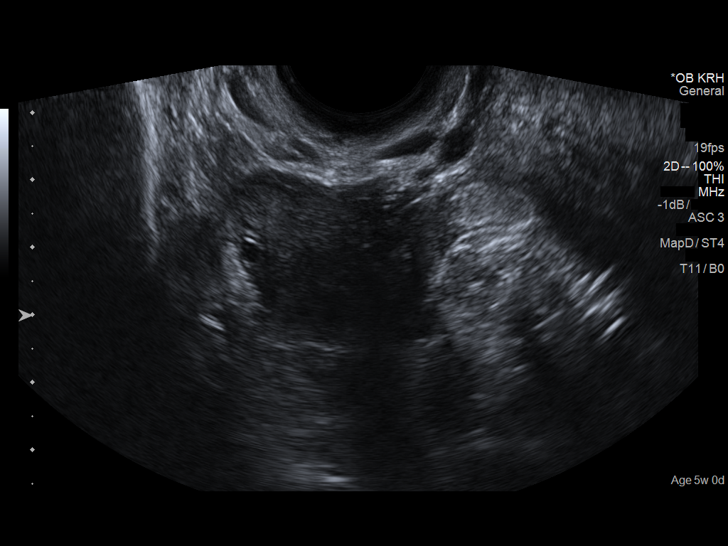
[im 24/28]
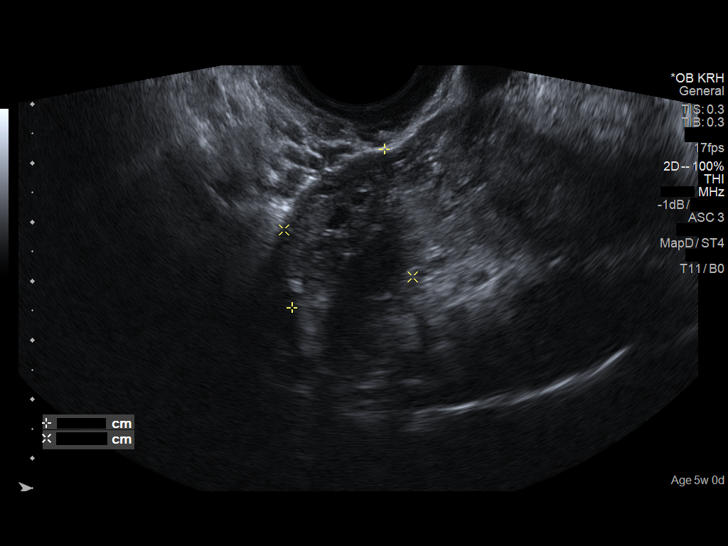
[im 26/28]
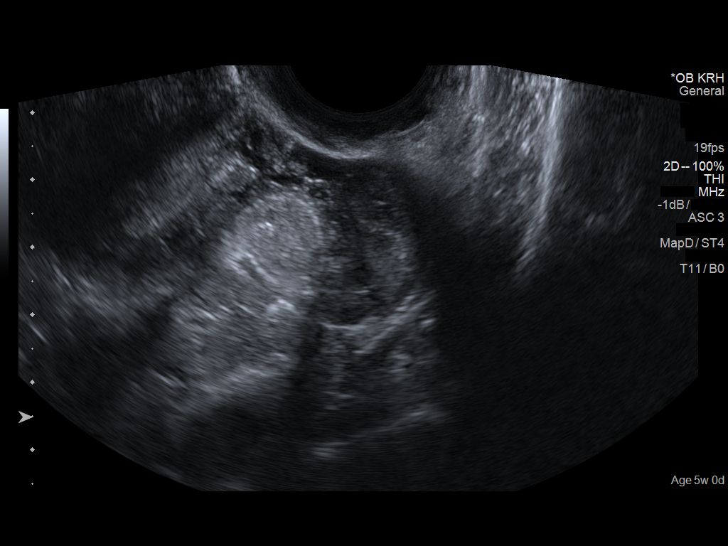
[im 28/28]
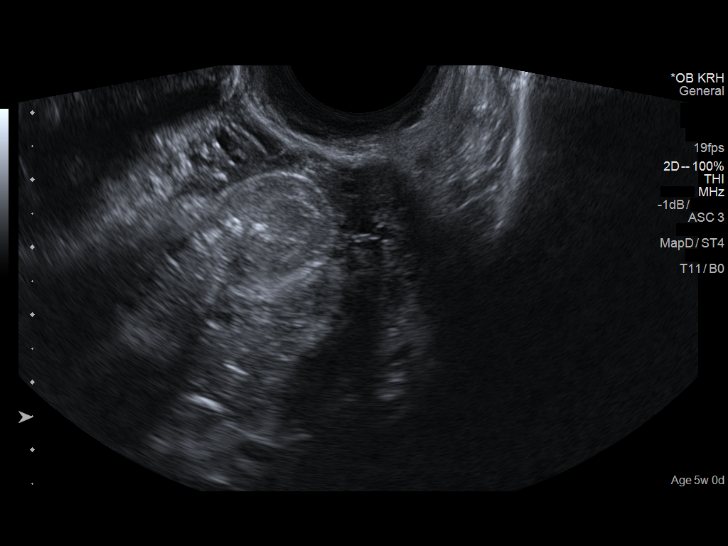

[14 of 28 positions shown; findings below may reference images not displayed]

FINDINGS: Intrauterine gestational sac: Single

Yolk sac:  Not Visualized.

Embryo:  Not Visualized.

Cardiac Activity: Not Visualized.

Heart Rate: Not visualized bpm

MSD: 9.2  mm   5 w   5 d

CRL:    mm    w    d                  US EDC:

Subchorionic hemorrhage:  None visualized.

Maternal uterus/adnexae: Normal bilateral ovaries. There is a small
amount of free fluid.
IMPRESSION: Single intrauterine gestational sac is identified. No yolk sac or
embryo is identified. This may be due to early pregnancy. Short-term
follow-up ultrasound in 2 weeks is recommended.

## 2020-03-15 IMAGING — DX RIGHT WRIST - COMPLETE 3+ VIEW
4 series · 4 of 4 positions shown · non-contrast
Comparison: None.

CLINICAL DATA: Posttraumatic right wrist pain for 2-3 weeks

EXAM:
RIGHT WRIST - COMPLETE 3+ VIEW

[wrist pa]
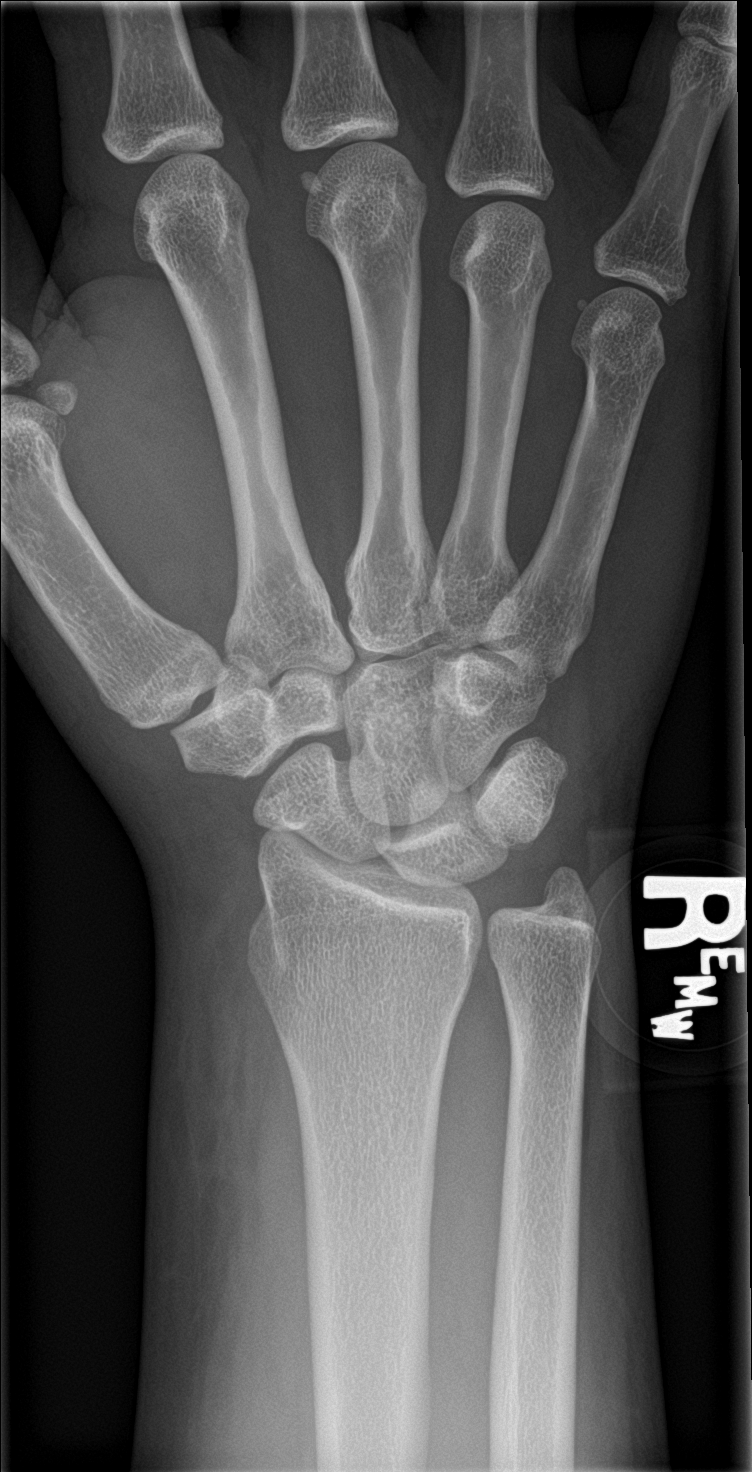

[wrist obl]
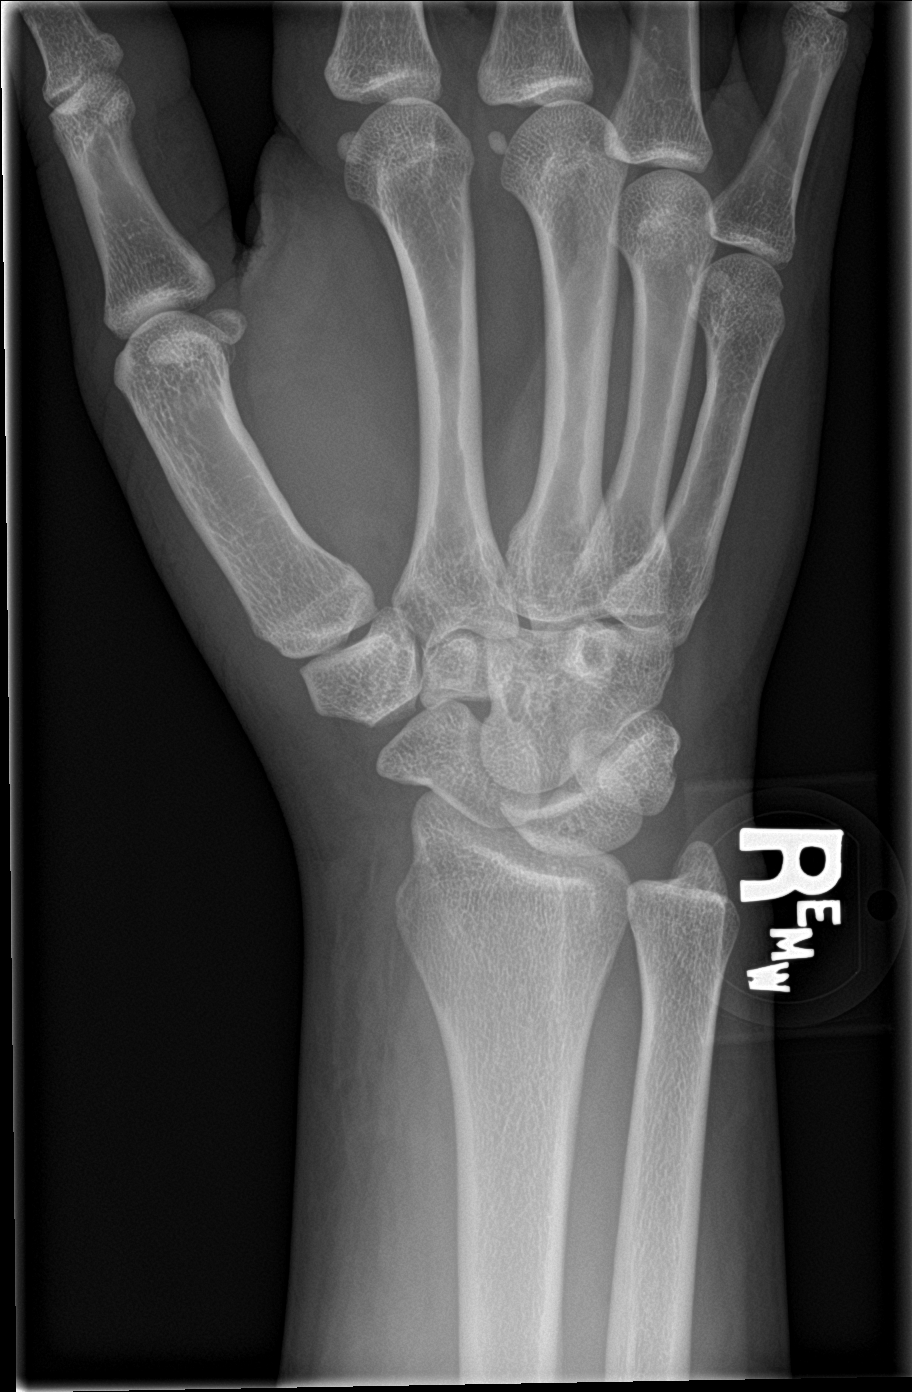

[wrist lat]
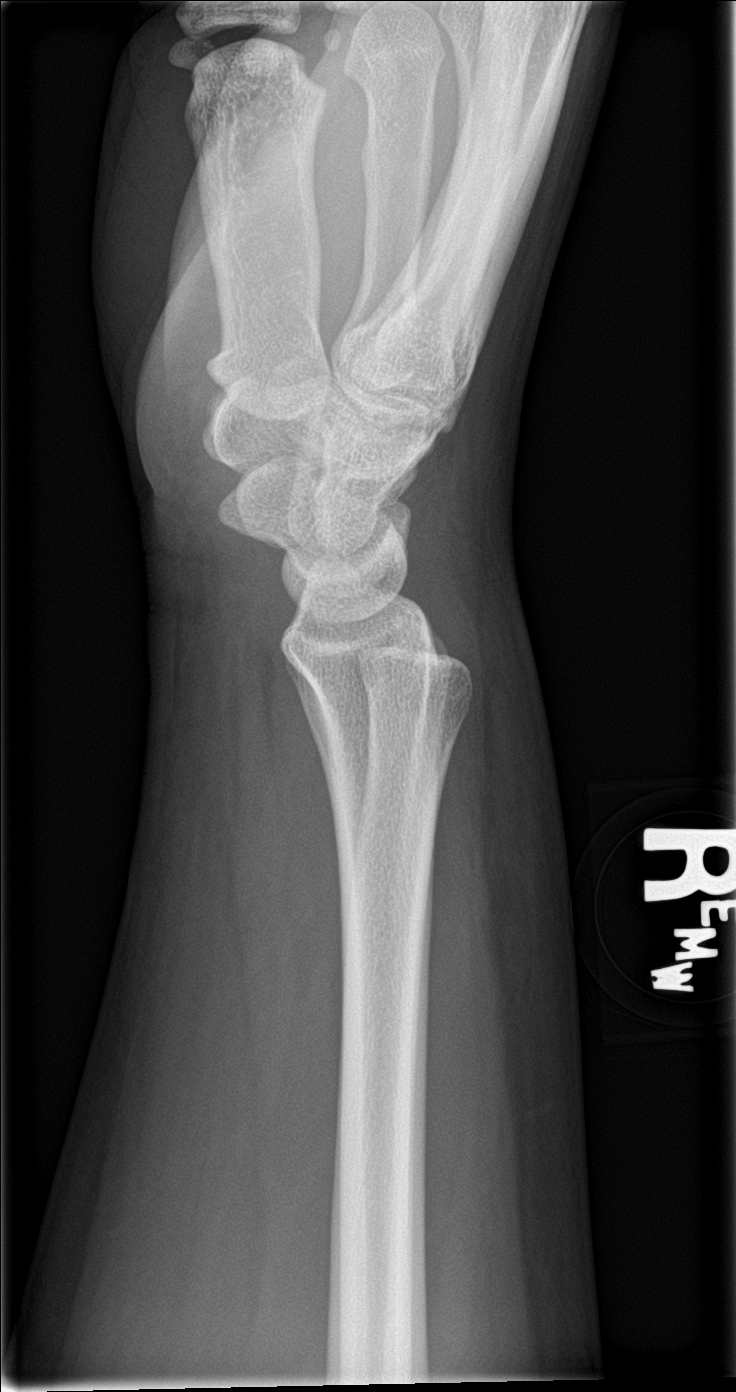

[wrist navicular]
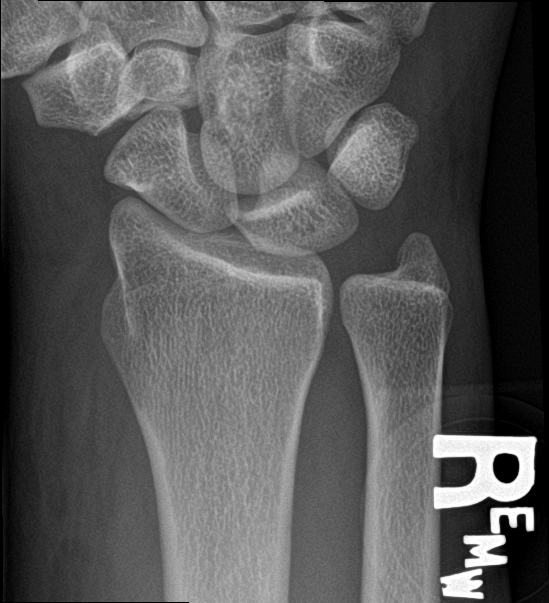

[4 of 4 positions shown; findings below may reference images not displayed]

FINDINGS: There is no evidence of fracture or dislocation. There is no
evidence of arthropathy or other focal bone abnormality. Soft
tissues are unremarkable.
IMPRESSION: Negative.
